# Patient Record
Sex: Male | Born: 1960
Health system: Southern US, Community
[De-identification: ages and names within clinical notes are randomized; demographics above are authoritative.]

## PROBLEM LIST (undated history)

## (undated) DIAGNOSIS — E785 Hyperlipidemia, unspecified: Secondary | ICD-10-CM

## (undated) DIAGNOSIS — G8929 Other chronic pain: Secondary | ICD-10-CM

## (undated) DIAGNOSIS — I10 Essential (primary) hypertension: Secondary | ICD-10-CM

## (undated) DIAGNOSIS — M549 Dorsalgia, unspecified: Secondary | ICD-10-CM

## (undated) DIAGNOSIS — C23 Malignant neoplasm of gallbladder: Secondary | ICD-10-CM

## (undated) DIAGNOSIS — I499 Cardiac arrhythmia, unspecified: Secondary | ICD-10-CM

## (undated) HISTORY — DX: Other chronic pain: G89.29

## (undated) HISTORY — PX: BACK SURGERY: SHX140

## (undated) HISTORY — DX: Cardiac arrhythmia, unspecified: I49.9

## (undated) HISTORY — DX: Dorsalgia, unspecified: M54.9

## (undated) HISTORY — DX: Hyperlipidemia, unspecified: E78.5

## (undated) HISTORY — DX: Essential (primary) hypertension: I10

---

## 2011-10-12 ENCOUNTER — Other Ambulatory Visit: Payer: Self-pay | Admitting: Internal Medicine

## 2011-10-12 DIAGNOSIS — M545 Low back pain, unspecified: Secondary | ICD-10-CM

## 2011-10-16 ENCOUNTER — Ambulatory Visit
Admission: RE | Admit: 2011-10-16 | Discharge: 2011-10-16 | Disposition: A | Discharge status: Home / Self Care | Payer: Self-pay | Source: Ambulatory Visit | Attending: Internal Medicine | Admitting: Internal Medicine

## 2011-10-16 DIAGNOSIS — M545 Low back pain, unspecified: Secondary | ICD-10-CM

## 2011-10-26 ENCOUNTER — Emergency Department: Payer: Self-pay | Admitting: Unknown Physician Specialty

## 2011-11-01 ENCOUNTER — Emergency Department: Payer: Self-pay

## 2014-01-02 ENCOUNTER — Encounter: Payer: Self-pay | Admitting: Cardiovascular Disease

## 2014-01-02 ENCOUNTER — Ambulatory Visit: Payer: Self-pay | Admitting: Cardiovascular Disease

## 2014-01-02 ENCOUNTER — Encounter (INDEPENDENT_AMBULATORY_CARE_PROVIDER_SITE_OTHER): Payer: Self-pay

## 2014-01-02 ENCOUNTER — Ambulatory Visit (INDEPENDENT_AMBULATORY_CARE_PROVIDER_SITE_OTHER): Payer: BC Managed Care – PPO | Admitting: Cardiovascular Disease

## 2014-01-02 VITALS — BP 159/90 | HR 58 | Ht 72.0 in | Wt 244.0 lb

## 2014-01-02 DIAGNOSIS — I10 Essential (primary) hypertension: Secondary | ICD-10-CM | POA: Insufficient documentation

## 2014-01-02 DIAGNOSIS — I499 Cardiac arrhythmia, unspecified: Secondary | ICD-10-CM

## 2014-01-02 DIAGNOSIS — E785 Hyperlipidemia, unspecified: Secondary | ICD-10-CM

## 2014-01-02 MED ORDER — CARVEDILOL 6.25 MG PO TABS: 6.2500 mg | ORAL_TABLET | Freq: Two times a day (BID) | ORAL | Status: DC

## 2014-01-02 MED ORDER — AMLODIPINE BESYLATE 5 MG PO TABS: 5.0000 mg | ORAL_TABLET | Freq: Every day | ORAL | Status: DC

## 2014-01-02 NOTE — Patient Instructions (Signed)
Stop taking Concor and Hydrochlorothiazide.   Start Amlodipine 5 mg once daily.  Start Carvedilol (Coreg) 6.25 mg twice daily.   Follow up in 2 months.

## 2014-01-02 NOTE — Assessment & Plan Note (Signed)
He will require a fasting lipid profile.

## 2014-01-02 NOTE — Assessment & Plan Note (Signed)
I made the following changes in his medications. I stopped Bisoprolol/amlodipine given that this formulation is not available in the [place]. I stopped hydrochlorothiazide. Continue treatment with Enalapril.  I added amlodipine 5 mg once daily and carvedilol 6.25 mg twice daily.  I discussed with him the importance of low sodium diet and decreasing the amount of alcohol intake.

## 2014-01-02 NOTE — Progress Notes (Signed)
   HPI  This is a 53 year old Arabic speaking male who is here today to establish cardiovascular care for management of hypertension. He is originally from [place]. He has no previous cardiac history but reports palpitations which improved with a beta blocker. He reports prolonged history of hypertension requiring multiple medications. No history of ischemic heart disease, stroke or congestive heart failure. He had back surgery done in [place] last year. He does smoke cigars and drinks excessive alcohol. He denies any chest pain, dyspnea or palpitations. He had "noise in his ears" after taking enalapril and hydrochlorothiazide.  No Known Allergies   No current outpatient prescriptions on file prior to visit.   No current facility-administered medications on file prior to visit.     Past Medical History  Diagnosis Date  . Irregular heart beat   . Hypertension   . Hyperlipidemia   . Chronic back pain      Past Surgical History  Procedure Laterality Date  . Back surgery       Family History  Problem Relation Age of Onset  . Family history unknown: Yes     History   Social History  . Marital Status: Married    Spouse Name: N/A    Number of Children: N/A  . Years of Education: N/A   Occupational History  . Not on file.   Social History Main Topics  . Smoking status: Current Some Day Smoker    Types: Cigars  . Smokeless tobacco: Not on file  . Alcohol Use: Yes     Comment: daily  . Drug Use: No  . Sexual Activity: Not on file   Other Topics Concern  . Not on file   Social History Narrative  . No narrative on file     ROS A 10 point review of system was performed. It is negative other than that mentioned in the history of present illness.   PHYSICAL EXAM   BP 159/90  Pulse 58  Ht 6' (1.829 m)  Wt 244 lb (110.678 kg)  BMI 33.09 kg/m2 Constitutional: He is oriented to person, place, and time. He appears well-developed and well-nourished. No distress.    HENT: No nasal discharge.  Head: Normocephalic and atraumatic.  Eyes: Pupils are equal and round.  No discharge. Neck: Normal range of motion. Neck supple. No JVD present. No thyromegaly present.  Cardiovascular: Normal rate, regular rhythm, normal heart sounds. Exam reveals no gallop and no friction rub. No murmur heard.  Pulmonary/Chest: Effort normal and breath sounds normal. No stridor. No respiratory distress. He has no wheezes. He has no rales. He exhibits no tenderness.  Abdominal: Soft. Bowel sounds are normal. He exhibits no distension. There is no tenderness. There is no rebound and no guarding.  Musculoskeletal: Normal range of motion. He exhibits no edema and no tenderness.  Neurological: He is alert and oriented to person, place, and time. Coordination normal.  Skin: Skin is warm and dry. No rash noted. He is not diaphoretic. No erythema. No pallor.  Psychiatric: He has a normal mood and affect. His behavior is normal. Judgment and thought content normal.       NWG:NFAOZEKG:Sinus  Bradycardia  -Left axis -anterior fascicular block.   -  Nonspecific T-abnormality.   ABNORMAL     ASSESSMENT AND PLAN

## 2014-03-06 ENCOUNTER — Ambulatory Visit (INDEPENDENT_AMBULATORY_CARE_PROVIDER_SITE_OTHER): Payer: BC Managed Care – PPO | Admitting: Cardiovascular Disease

## 2014-03-06 ENCOUNTER — Encounter: Payer: Self-pay | Admitting: Cardiovascular Disease

## 2014-03-06 ENCOUNTER — Encounter (INDEPENDENT_AMBULATORY_CARE_PROVIDER_SITE_OTHER): Payer: Self-pay

## 2014-03-06 VITALS — BP 160/110 | HR 52 | Ht 71.0 in | Wt 237.8 lb

## 2014-03-06 DIAGNOSIS — E785 Hyperlipidemia, unspecified: Secondary | ICD-10-CM

## 2014-03-06 DIAGNOSIS — I10 Essential (primary) hypertension: Secondary | ICD-10-CM

## 2014-03-06 MED ORDER — AMLODIPINE BESYLATE 10 MG PO TABS: 10.0000 mg | ORAL_TABLET | Freq: Every day | ORAL | Status: DC

## 2014-03-06 NOTE — Assessment & Plan Note (Signed)
Blood pressure is elevated today. He reports eating processed meat for lunch . I had a prolonged discussion with him about the importance of low sodium diet. We provided him with written education regarding low-sodium diet. I increased the dose of amlodipine to 10 mg once daily. The dose of carvedilol be increased further due to bradycardia. If blood pressure remains uncontrolled, I would consider adding spironolactone.

## 2014-03-06 NOTE — Progress Notes (Signed)
HPI  This is a 53 year old Arabic speaking male who is here today for a followup visit regarding  hypertension. He is originally from [place]. He has no previous cardiac history but reports palpitations which improved with a beta blocker. He reports prolonged history of hypertension requiring multiple medications. No history of ischemic heart disease, stroke or congestive heart failure. He had back surgery done in [place] last year. He does smoke cigars and drinks excessive alcohol. He denies any chest pain, dyspnea or palpitations. He had "noise in his ears" after taking enalapril and hydrochlorothiazide. I made some changes in his medication during last visit. He reports no side effects to new medications. Blood pressure at home is ranging from 130 to 145 mm mercury systolic. He continues to consume excessive amount of sodium.     No Known Allergies   Current Outpatient Prescriptions on File Prior to Visit  Medication Sig Dispense Refill  . carvedilol (COREG) 6.25 MG tablet Take 1 tablet (6.25 mg total) by mouth 2 (two) times daily.  60 tablet  6  . enalapril (VASOTEC) 20 MG tablet Take 20 mg by mouth daily.       No current facility-administered medications on file prior to visit.     Past Medical History  Diagnosis Date  . Irregular heart beat   . Hypertension   . Hyperlipidemia   . Chronic back pain      Past Surgical History  Procedure Laterality Date  . Back surgery       History reviewed. No pertinent family history.   History   Social History  . Marital Status: Married    Spouse Name: N/A    Number of Children: N/A  . Years of Education: N/A   Occupational History  . Not on file.   Social History Main Topics  . Smoking status: Current Some Day Smoker    Types: Cigars  . Smokeless tobacco: Not on file  . Alcohol Use: Yes     Comment: daily  . Drug Use: No  . Sexual Activity: Not on file   Other Topics Concern  . Not on file   Social History  Narrative  . No narrative on file     ROS A 10 point review of system was performed. It is negative other than that mentioned in the history of present illness.   PHYSICAL EXAM   BP 160/110  Pulse 52  Ht 5\' 11"  (1.803 m)  Wt 237 lb 12 oz (107.843 kg)  BMI 33.17 kg/m2 Constitutional: He is oriented to person, place, and time. He appears well-developed and well-nourished. No distress.  HENT: No nasal discharge.  Head: Normocephalic and atraumatic.  Eyes: Pupils are equal and round.  No discharge. Neck: Normal range of motion. Neck supple. No JVD present. No thyromegaly present.  Cardiovascular: Bradycardic, regular rhythm, normal heart sounds. Exam reveals no gallop and no friction rub. No murmur heard.  Pulmonary/Chest: Effort normal and breath sounds normal. No stridor. No respiratory distress. He has no wheezes. He has no rales. He exhibits no tenderness.  Abdominal: Soft. Bowel sounds are normal. He exhibits no distension. There is no tenderness. There is no rebound and no guarding.  Musculoskeletal: Normal range of motion. He exhibits no edema and no tenderness.  Neurological: He is alert and oriented to person, place, and time. Coordination normal.  Skin: Skin is warm and dry. No rash noted. He is not diaphoretic. No erythema. No pallor.  Psychiatric: He has a normal mood  and affect. His behavior is normal. Judgment and thought content normal.          ASSESSMENT AND PLAN

## 2014-03-06 NOTE — Patient Instructions (Signed)
Your physician has recommended you make the following change in your medication:  Increase Amlodipine to 10 mg once daily   Follow low sodium diet   Your physician recommends that you schedule a follow-up appointment in:  3 months

## 2014-03-06 NOTE — Assessment & Plan Note (Signed)
He will require a fasting lipid profile. 

## 2014-06-08 ENCOUNTER — Ambulatory Visit: Payer: BC Managed Care – PPO | Admitting: Cardiovascular Disease

## 2014-07-09 ENCOUNTER — Encounter: Payer: Self-pay | Admitting: Cardiovascular Disease

## 2014-07-09 ENCOUNTER — Ambulatory Visit (INDEPENDENT_AMBULATORY_CARE_PROVIDER_SITE_OTHER): Payer: BC Managed Care – PPO | Admitting: Cardiovascular Disease

## 2014-07-09 VITALS — BP 140/100 | HR 60 | Ht 71.0 in | Wt 229.4 lb

## 2014-07-09 DIAGNOSIS — E785 Hyperlipidemia, unspecified: Secondary | ICD-10-CM

## 2014-07-09 DIAGNOSIS — I10 Essential (primary) hypertension: Secondary | ICD-10-CM

## 2014-07-09 MED ORDER — EPLERENONE 25 MG PO TABS: 25.0000 mg | ORAL_TABLET | Freq: Every day | ORAL | Status: DC

## 2014-07-09 NOTE — Patient Instructions (Signed)
Start Eplerenone 25 mg once daily. Continue other medications.   Return for labs in 1 week.   Follow up in 3 months.

## 2014-07-09 NOTE — Progress Notes (Signed)
HPI  This is a 53 year old Arabic speakin52g male who is here today for a followup visit regarding  hypertension. He is originally from [place]. He has no previous cardiac history but reports palpitations which improved with a beta blocker. He reports prolonged history of hypertension requiring multiple medications. No history of ischemic heart disease, stroke or congestive heart failure. He had back surgery done in [place] last year. He does smoke cigars and drinks excessive alcohol. He denies any chest pain, dyspnea or palpitations. He had "noise in his ears" after taking enalapril and hydrochlorothiazide. This resolved after stopping hydrochlorothiazide. Amlodipine was increased during last visit. Blood pressure improved but is still not optimal.     No Known Allergies   Current Outpatient Prescriptions on File Prior to Visit  Medication Sig Dispense Refill  . amLODipine (NORVASC) 10 MG tablet Take 1 tablet (10 mg total) by mouth daily. 90 tablet 3  . carvedilol (COREG) 6.25 MG tablet Take 1 tablet (6.25 mg total) by mouth 2 (two) times daily. (Patient taking differently: Take 6.25 mg by mouth daily. ) 60 tablet 6  . enalapril (VASOTEC) 20 MG tablet Take 20 mg by mouth daily.     No current facility-administered medications on file prior to visit.     Past Medical History  Diagnosis Date  . Irregular heart beat   . Hypertension   . Hyperlipidemia   . Chronic back pain      Past Surgical History  Procedure Laterality Date  . Back surgery       No family history on file.   History   Social History  . Marital Status: Married    Spouse Name: N/A    Number of Children: N/A  . Years of Education: N/A   Occupational History  . Not on file.   Social History Main Topics  . Smoking status: Current Some Day Smoker    Types: Cigars  . Smokeless tobacco: Not on file  . Alcohol Use: Yes     Comment: daily  . Drug Use: No  . Sexual Activity: Not on file   Other Topics  Concern  . Not on file   Social History Narrative     ROS A 10 point review of system was performed. It is negative other than that mentioned in the history of present illness.   PHYSICAL EXAM   BP 140/100 mmHg  Pulse 60  Ht 5\' 11"  (1.803 m)  Wt 229 lb 6.4 oz (104.055 kg)  BMI 32.01 kg/m2 Constitutional: He is oriented to person, place, and time. He appears well-developed and well-nourished. No distress.  HENT: No nasal discharge.  Head: Normocephalic and atraumatic.  Eyes: Pupils are equal and round.  No discharge. Neck: Normal range of motion. Neck supple. No JVD present. No thyromegaly present.  Cardiovascular: Bradycardic, regular rhythm, normal heart sounds. Exam reveals no gallop and no friction rub. No murmur heard.  Pulmonary/Chest: Effort normal and breath sounds normal. No stridor. No respiratory distress. He has no wheezes. He has no rales. He exhibits no tenderness.  Abdominal: Soft. Bowel sounds are normal. He exhibits no distension. There is no tenderness. There is no rebound and no guarding.  Musculoskeletal: Normal range of motion. He exhibits no edema and no tenderness.  Neurological: He is alert and oriented to person, place, and time. Coordination normal.  Skin: Skin is warm and dry. No rash noted. He is not diaphoretic. No erythema. No pallor.  Psychiatric: He has a normal mood and  affect. His behavior is normal. Judgment and thought content normal.          ASSESSMENT AND PLAN

## 2014-07-10 NOTE — Assessment & Plan Note (Signed)
Check fasting lipid and liver profile with his labs.

## 2014-07-10 NOTE — Assessment & Plan Note (Signed)
Blood pressure improved from before but still not optimal. I added eplerenone 25 mg once daily. Check basic metabolic profile in one week.

## 2014-07-13 ENCOUNTER — Telehealth: Payer: Self-pay | Admitting: *Deleted

## 2014-07-13 DIAGNOSIS — E785 Hyperlipidemia, unspecified: Secondary | ICD-10-CM

## 2014-07-13 NOTE — Telephone Encounter (Signed)
-----   Message from [place] OuchMuhammad A Arida, MD sent at 07/10/2014  3:38 PM EST ----- He is coming for BMP next week. Can you also do fasting lipid and liver profile?

## 2014-07-15 ENCOUNTER — Other Ambulatory Visit (INDEPENDENT_AMBULATORY_CARE_PROVIDER_SITE_OTHER): Payer: BC Managed Care – PPO

## 2014-07-15 DIAGNOSIS — E785 Hyperlipidemia, unspecified: Secondary | ICD-10-CM

## 2014-07-15 DIAGNOSIS — I10 Essential (primary) hypertension: Secondary | ICD-10-CM

## 2014-07-16 LAB — BASIC METABOLIC PANEL
BUN/Creatinine Ratio: 16 (ref 9–20)
BUN: 17 mg/dL (ref 6–24)
CHLORIDE: 99 mmol/L (ref 97–108)
CO2: 24 mmol/L (ref 18–29)
CREATININE: 1.05 mg/dL (ref 0.76–1.27)
Calcium: 9.4 mg/dL (ref 8.7–10.2)
GFR calc Af Amer: 94 mL/min/{1.73_m2} (ref >59)
GFR calc non Af Amer: 81 mL/min/{1.73_m2} (ref >59)
GLUCOSE: 68 mg/dL (ref 65–99)
Potassium: 4.5 mmol/L (ref 3.5–5.2)
Sodium: 139 mmol/L (ref 134–144)

## 2014-07-16 LAB — HEPATIC FUNCTION PANEL
ALT: 20 IU/L (ref 0–44)
AST: 26 IU/L (ref 0–40)
Albumin: 4.6 g/dL (ref 3.5–5.5)
Alkaline Phosphatase: 92 IU/L (ref 39–117)
BILIRUBIN DIRECT: 0.15 mg/dL (ref 0.00–0.40)
BILIRUBIN TOTAL: 0.6 mg/dL (ref 0.0–1.2)
Total Protein: 7.4 g/dL (ref 6.0–8.5)

## 2014-07-16 LAB — LIPID PANEL
Chol/HDL Ratio: 4.7 ratio units (ref 0.0–5.0)
Cholesterol, Total: 151 mg/dL (ref 100–199)
HDL: 32 mg/dL — ABNORMAL LOW (ref >39)
LDL CALC: 80 mg/dL (ref 0–99)
TRIGLYCERIDES: 195 mg/dL — AB (ref 0–149)
VLDL Cholesterol Cal: 39 mg/dL (ref 5–40)

## 2014-10-09 ENCOUNTER — Encounter (INDEPENDENT_AMBULATORY_CARE_PROVIDER_SITE_OTHER): Payer: Self-pay

## 2014-10-09 ENCOUNTER — Ambulatory Visit (INDEPENDENT_AMBULATORY_CARE_PROVIDER_SITE_OTHER): Payer: No Typology Code available for payment source | Admitting: Cardiovascular Disease

## 2014-10-09 ENCOUNTER — Encounter: Payer: Self-pay | Admitting: Cardiovascular Disease

## 2014-10-09 VITALS — BP 130/86 | HR 64 | Ht 72.0 in | Wt 229.5 lb

## 2014-10-09 DIAGNOSIS — E785 Hyperlipidemia, unspecified: Secondary | ICD-10-CM

## 2014-10-09 DIAGNOSIS — I10 Essential (primary) hypertension: Secondary | ICD-10-CM

## 2014-10-09 NOTE — Assessment & Plan Note (Signed)
Lab Results  Component Value Date   HDL 32* 07/15/2014   LDLCALC 80 07/15/2014   TRIG 195* 07/15/2014   CHOLHDL 4.7 07/15/2014   He has low HDL and mildly elevated triglyceride. This should improve with increased exercise and weight loss.

## 2014-10-09 NOTE — Progress Notes (Signed)
HPI  This is a 54 year old Arabic speaking male who is here today for a followup visit regarding  hypertension. He is originally from [place]. He has no previous cardiac history but reports palpitations which improved with a beta blocker. He reports prolonged history of hypertension requiring multiple medications. No history of ischemic heart disease, stroke or congestive heart failure. He had back surgery done in [place] last year. He does smoke cigars and drinks excessive alcohol. He denies any chest pain, dyspnea or palpitations. He had "noise in his ears" after taking enalapril and hydrochlorothiazide. This resolved after stopping hydrochlorothiazide. I added eplerenone during last visit with significant improvement in blood pressure. Follow-up basic metabolic profile was normal.     No Known Allergies   Current Outpatient Prescriptions on File Prior to Visit  Medication Sig Dispense Refill  . amLODipine (NORVASC) 10 MG tablet Take 1 tablet (10 mg total) by mouth daily. 90 tablet 3  . carvedilol (COREG) 6.25 MG tablet Take 1 tablet (6.25 mg total) by mouth 2 (two) times daily. (Patient taking differently: Take 6.25 mg by mouth daily. ) 60 tablet 6  . enalapril (VASOTEC) 20 MG tablet Take 20 mg by mouth daily.    Marland Kitchen eplerenone (INSPRA) 25 MG tablet Take 1 tablet (25 mg total) by mouth daily. 30 tablet 6   No current facility-administered medications on file prior to visit.     Past Medical History  Diagnosis Date  . Irregular heart beat   . Hypertension   . Hyperlipidemia   . Chronic back pain      Past Surgical History  Procedure Laterality Date  . Back surgery       Family History  Problem Relation Age of Onset  . Family history unknown: Yes     History   Social History  . Marital Status: Married    Spouse Name: N/A  . Number of Children: N/A  . Years of Education: N/A   Occupational History  . Not on file.   Social History Main Topics  . Smoking status:  Current Some Day Smoker    Types: Cigars  . Smokeless tobacco: Not on file  . Alcohol Use: Yes     Comment: daily  . Drug Use: No  . Sexual Activity: Not on file   Other Topics Concern  . Not on file   Social History Narrative     ROS A 10 point review of system was performed. It is negative other than that mentioned in the history of present illness.   PHYSICAL EXAM   BP 130/86 mmHg  Pulse 64  Ht 6' (1.829 m)  Wt 229 lb 8 oz (104.101 kg)  BMI 31.12 kg/m2 Constitutional: He is oriented to person, place, and time. He appears well-developed and well-nourished. No distress.  HENT: No nasal discharge.  Head: Normocephalic and atraumatic.  Eyes: Pupils are equal and round.  No discharge. Neck: Normal range of motion. Neck supple. No JVD present. No thyromegaly present.  Cardiovascular: Bradycardic, regular rhythm, normal heart sounds. Exam reveals no gallop and no friction rub. No murmur heard.  Pulmonary/Chest: Effort normal and breath sounds normal. No stridor. No respiratory distress. He has no wheezes. He has no rales. He exhibits no tenderness.  Abdominal: Soft. Bowel sounds are normal. He exhibits no distension. There is no tenderness. There is no rebound and no guarding.  Musculoskeletal: Normal range of motion. He exhibits no edema and no tenderness.  Neurological: He is alert and oriented to  person, place, and time. Coordination normal.  Skin: Skin is warm and dry. No rash noted. He is not diaphoretic. No erythema. No pallor.  Psychiatric: He has a normal mood and affect. His behavior is normal. Judgment and thought content normal.          ASSESSMENT AND PLAN

## 2014-10-09 NOTE — Assessment & Plan Note (Signed)
Blood pressure is now well controlled on current medications. I made no changes. I advised him to cut down on alcohol intake. He is already exercising more and has lost weight since his last visit.

## 2014-10-09 NOTE — Patient Instructions (Signed)
Continue same medications.   Your physician wants you to follow-up in: 6 months.  You will receive a reminder letter in the mail two months in advance. If you don't receive a letter, please call our office to schedule the follow-up appointment.  

## 2015-06-24 ENCOUNTER — Telehealth: Payer: Self-pay | Admitting: Cardiovascular Disease

## 2015-06-24 NOTE — Telephone Encounter (Signed)
3 attempts to schedule from recall. List  Deleting recall.

## 2015-09-06 ENCOUNTER — Ambulatory Visit (INDEPENDENT_AMBULATORY_CARE_PROVIDER_SITE_OTHER): Payer: Self-pay | Admitting: Cardiovascular Disease

## 2015-09-06 ENCOUNTER — Encounter: Payer: Self-pay | Admitting: Cardiovascular Disease

## 2015-09-06 VITALS — BP 160/90 | HR 56 | Ht 71.0 in | Wt 231.5 lb

## 2015-09-06 DIAGNOSIS — I10 Essential (primary) hypertension: Secondary | ICD-10-CM

## 2015-09-06 DIAGNOSIS — E785 Hyperlipidemia, unspecified: Secondary | ICD-10-CM

## 2015-09-06 MED ORDER — CARVEDILOL 6.25 MG PO TABS: 6.2500 mg | ORAL_TABLET | Freq: Every day | ORAL | Status: DC

## 2015-09-06 MED ORDER — ENALAPRIL MALEATE 20 MG PO TABS: 20.0000 mg | ORAL_TABLET | Freq: Every day | ORAL | Status: DC

## 2015-09-06 MED ORDER — EPLERENONE 25 MG PO TABS: 25.0000 mg | ORAL_TABLET | Freq: Every day | ORAL | Status: DC

## 2015-09-06 MED ORDER — AMLODIPINE BESYLATE 10 MG PO TABS: 10.0000 mg | ORAL_TABLET | Freq: Every day | ORAL | Status: DC

## 2015-09-06 NOTE — Assessment & Plan Note (Signed)
Lab Results  Component Value Date   CHOL 151 07/15/2014   HDL 32* 07/15/2014   LDLCALC 80 07/15/2014   TRIG 195* 07/15/2014   CHOLHDL 4.7 07/15/2014   He has low HDL and mildly elevated lipid profile. I requested a repeat follow-up lipid and liver profile.

## 2015-09-06 NOTE — Patient Instructions (Addendum)
Medication Instructions:  Your physician recommends that you continue on your current medications as directed. Please refer to the Current Medication list given to you today.   Labwork: Fasting liver and lipid profile. Nothing to eat or drink after midnight the evening before your labs.  BMET  Testing/Procedures: none  Follow-Up: Your physician wants you to follow-up in: one year with Dr. Kirke CorinArida.  You will receive a reminder letter in the mail two months in advance. If you don't receive a letter, please call our office to schedule the follow-up appointment.   Any Other Special Instructions Will Be Listed Below (If Applicable).     If you need a refill on your cardiac medications before your next appointment, please call your pharmacy.

## 2015-09-06 NOTE — Progress Notes (Signed)
HPI  This is a 55 year old Arabic speaking male who is here today for a followup visit regarding  hypertension. He is originally from [place]. He has no previous cardiac history but reports palpitations which improved with a beta blocker. He reports prolonged history of hypertension requiring multiple medications. No history of ischemic heart disease, stroke or congestive heart failure. He had back surgery done in [place]. He does smoke cigars and drinks excessive alcohol. He denies any chest pain, dyspnea or palpitations. He had "noise in his ears" after  hydrochlorothiazide.  He has been taking his medications regularly. He takes carvedilol once daily instead of twice daily. His blood pressure is well controlled at home with systolic blood pressure in the 130 range and sometimes it goes down to around 110. His blood pressure is elevated today but he was rushing to get his appointment.      No Known Allergies   Current Outpatient Prescriptions on File Prior to Visit  Medication Sig Dispense Refill  . amLODipine (NORVASC) 10 MG tablet Take 1 tablet (10 mg total) by mouth daily. 90 tablet 3  . carvedilol (COREG) 6.25 MG tablet Take 1 tablet (6.25 mg total) by mouth 2 (two) times daily. (Patient taking differently: Take 6.25 mg by mouth daily. ) 60 tablet 6  . enalapril (VASOTEC) 20 MG tablet Take 20 mg by mouth daily.    Marland Kitchen. eplerenone (INSPRA) 25 MG tablet Take 1 tablet (25 mg total) by mouth daily. 30 tablet 6   No current facility-administered medications on file prior to visit.     Past Medical History  Diagnosis Date  . Irregular heart beat   . Hypertension   . Hyperlipidemia   . Chronic back pain      Past Surgical History  Procedure Laterality Date  . Back surgery       Family History  Problem Relation Age of Onset  . Family history unknown: Yes     Social History   Social History  . Marital Status: Married    Spouse Name: N/A  . Number of Children: N/A  .  Years of Education: N/A   Occupational History  . Not on file.   Social History Main Topics  . Smoking status: Current Some Day Smoker    Types: Cigars  . Smokeless tobacco: Not on file  . Alcohol Use: Yes     Comment: daily  . Drug Use: No  . Sexual Activity: Not on file   Other Topics Concern  . Not on file   Social History Narrative     ROS A 10 point review of system was performed. It is negative other than that mentioned in the history of present illness.   PHYSICAL EXAM   BP 160/90 mmHg  Pulse 56  Ht 5\' 11"  (1.803 m)  Wt 231 lb 8 oz (105.008 kg)  BMI 32.30 kg/m2 Constitutional: He is oriented to person, place, and time. He appears well-developed and well-nourished. No distress.  HENT: No nasal discharge.  Head: Normocephalic and atraumatic.  Eyes: Pupils are equal and round.  No discharge. Neck: Normal range of motion. Neck supple. No JVD present. No thyromegaly present.  Cardiovascular: Bradycardic, regular rhythm, normal heart sounds. Exam reveals no gallop and no friction rub. No murmur heard.  Pulmonary/Chest: Effort normal and breath sounds normal. No stridor. No respiratory distress. He has no wheezes. He has no rales. He exhibits no tenderness.  Abdominal: Soft. Bowel sounds are normal. He exhibits no distension. There  is no tenderness. There is no rebound and no guarding.  Musculoskeletal: Normal range of motion. He exhibits no edema and no tenderness.  Neurological: He is alert and oriented to person, place, and time. Coordination normal.  Skin: Skin is warm and dry. No rash noted. He is not diaphoretic. No erythema. No pallor.  Psychiatric: He has a normal mood and affect. His behavior is normal. Judgment and thought content normal.      EKG: Sinus bradycardia with left axis deviation. No significant ST or T wave changes.    ASSESSMENT AND PLAN

## 2015-09-06 NOTE — Assessment & Plan Note (Signed)
Blood pressure is well controlled at home although it is elevated today. I made no changes today. Check basic metabolic profile. I discussed with him the importance of low sodium diet and decreasing alcohol intake.

## 2015-09-08 ENCOUNTER — Other Ambulatory Visit: Payer: Self-pay

## 2015-09-08 DIAGNOSIS — E785 Hyperlipidemia, unspecified: Secondary | ICD-10-CM

## 2015-09-08 DIAGNOSIS — I10 Essential (primary) hypertension: Secondary | ICD-10-CM

## 2015-09-09 LAB — BASIC METABOLIC PANEL
BUN / CREAT RATIO: 16 (ref 9–20)
BUN: 19 mg/dL (ref 6–24)
CHLORIDE: 103 mmol/L (ref 96–106)
CO2: 22 mmol/L (ref 18–29)
Calcium: 9 mg/dL (ref 8.7–10.2)
Creatinine, Ser: 1.16 mg/dL (ref 0.76–1.27)
GFR, EST AFRICAN AMERICAN: 83 mL/min/{1.73_m2} (ref >59)
GFR, EST NON AFRICAN AMERICAN: 71 mL/min/{1.73_m2} (ref >59)
Glucose: 86 mg/dL (ref 65–99)
POTASSIUM: 4.2 mmol/L (ref 3.5–5.2)
SODIUM: 142 mmol/L (ref 134–144)

## 2015-09-09 LAB — HEPATIC FUNCTION PANEL
ALBUMIN: 4.7 g/dL (ref 3.5–5.5)
ALK PHOS: 88 IU/L (ref 39–117)
ALT: 29 IU/L (ref 0–44)
AST: 35 IU/L (ref 0–40)
BILIRUBIN TOTAL: 0.4 mg/dL (ref 0.0–1.2)
BILIRUBIN, DIRECT: 0.11 mg/dL (ref 0.00–0.40)
Total Protein: 7.4 g/dL (ref 6.0–8.5)

## 2015-09-09 LAB — LIPID PANEL
CHOLESTEROL TOTAL: 152 mg/dL (ref 100–199)
Chol/HDL Ratio: 5.2 ratio units — ABNORMAL HIGH (ref 0.0–5.0)
HDL: 29 mg/dL — AB (ref >39)
LDL CALC: 87 mg/dL (ref 0–99)
TRIGLYCERIDES: 178 mg/dL — AB (ref 0–149)
VLDL Cholesterol Cal: 36 mg/dL (ref 5–40)

## 2015-09-10 NOTE — Patient Instructions (Signed)
Fat and Cholesterol Restricted Diet High levels of fat and cholesterol in your blood may lead to various health problems, such as diseases of the heart, blood vessels, gallbladder, liver, and pancreas. Fats are concentrated sources of energy that come in various forms. Certain types of fat, including saturated fat, may be harmful in excess. Cholesterol is a substance needed by your body in small amounts. Your body makes all the cholesterol it needs. Excess cholesterol comes from the food you eat. When you have high levels of cholesterol and saturated fat in your blood, health problems can develop because the excess fat and cholesterol will gather along the walls of your blood vessels, causing them to narrow. Choosing the right foods will help you control your intake of fat and cholesterol. This will help keep the levels of these substances in your blood within normal limits and reduce your risk of disease. WHAT IS MY PLAN? Your health care provider recommends that you:  Limit your intake of saturated fatLimit the amount of cholesterol in your diet. WHAT TYPES OF FAT SHOULD I CHOOSE?  Choose healthy fats more often. Choose monounsaturated and polyunsaturated fats, such as olive and canola oil, flaxseeds, walnuts, almonds, and seeds.  Eat more omega-3 fats. Good choices include salmon, mackerel, sardines, tuna, flaxseed oil, and ground flaxseeds. Aim to eat fish at least two times a week.  Limit saturated fats. Saturated fats are primarily found in animal products, such as meats, butter, and cream. Plant sources of saturated fats include palm oil, palm kernel oil, and coconut oil.  Avoid foods with partially hydrogenated oils in them. These contain trans fats. Examples of foods that contain trans fats are stick margarine, some tub margarines, cookies, crackers, and other baked goods. WHAT GENERAL GUIDELINES DO I NEED TO FOLLOW? These guidelines for healthy eating will help you control your intake of  fat and cholesterol:  Check food labels carefully to identify foods with trans fats or high amounts of saturated fat.  Fill one half of your plate with vegetables and green salads.  Fill one fourth of your plate with whole grains. Look for the word "whole" as the first word in the ingredient list.  Fill one fourth of your plate with lean protein foods.  Limit fruit to two servings a day. Choose fruit instead of juice.  Eat more foods that contain soluble fiber. Examples of foods that contain this type of fiber are apples, broccoli, carrots, beans, peas, and barley. Aim to get 20-30 g of fiber per day.  Eat more home-cooked food and less restaurant, buffet, and fast food.  Limit or avoid alcohol.  Limit foods high in starch and sugar.  Limit fried foods.  Cook foods using methods other than frying. Baking, boiling, grilling, and broiling are all great options.  Lose weight if you are overweight. Losing just 5-10% of your initial body weight can help your overall health and prevent diseases such as diabetes and heart disease. WHAT FOODS CAN I EAT? Grains Whole grains, such as whole wheat or whole grain breads, crackers, cereals, and pasta. Unsweetened oatmeal, bulgur, barley, quinoa, or brown rice. Corn or whole wheat flour tortillas. Vegetables Fresh or frozen vegetables (raw, steamed, roasted, or grilled). Green salads. Fruits All fresh, canned (in natural juice), or frozen fruits. Meat and Other Protein Products Ground beef (85% or leaner), grass-fed beef, or beef trimmed of fat. Skinless chicken or [place]. Ground chicken or [place]. Pork trimmed of fat. All fish and seafood. Eggs. Dried beans, peas,  or lentils. Unsalted nuts or seeds. Unsalted canned or dry beans. Dairy Low-fat dairy products, such as skim or 1% milk, 2% or reduced-fat cheeses, low-fat ricotta or cottage cheese, or plain low-fat yogurt. Fats and Oils Tub margarines without trans fats. Light or reduced-fat  mayonnaise and salad dressings. Avocado. Olive, canola, sesame, or safflower oils. Natural peanut or almond butter (choose ones without added sugar and oil). The items listed above may not be a complete list of recommended foods or beverages. Contact your dietitian for more options. WHAT FOODS ARE NOT RECOMMENDED? Grains White bread. White pasta. White rice. Cornbread. Bagels, pastries, and croissants. Crackers that contain trans fat. Vegetables White potatoes. Corn. Creamed or fried vegetables. Vegetables in a cheese sauce. Fruits Dried fruits. Canned fruit in light or heavy syrup. Fruit juice. Meat and Other Protein Products Fatty cuts of meat. Ribs, chicken wings, bacon, sausage, bologna, salami, chitterlings, fatback, hot dogs, bratwurst, and packaged luncheon meats. Liver and organ meats. Dairy Whole or 2% milk, cream, half-and-half, and cream cheese. Whole milk cheeses. Whole-fat or sweetened yogurt. Full-fat cheeses. Nondairy creamers and whipped toppings. Processed cheese, cheese spreads, or cheese curds. Sweets and Desserts Corn syrup, sugars, honey, and molasses. Candy. Jam and jelly. Syrup. Sweetened cereals. Cookies, pies, cakes, donuts, muffins, and ice cream. Fats and Oils Butter, stick margarine, lard, shortening, ghee, or bacon fat. Coconut, palm kernel, or palm oils. Beverages Alcohol. Sweetened drinks (such as sodas, lemonade, and fruit drinks or punches). The items listed above may not be a complete list of foods and beverages to avoid. Contact your dietitian for more information.   This information is not intended to replace advice given to you by your health care provider. Make sure you discuss any questions you have with your health care provider.   Document Released: 08/07/2005 Document Revised: 08/28/2014 Document Reviewed: 11/05/2013 Elsevier Interactive Patient Education 2016 Else[employer] Inc.  Basic Carbohydrate Counting Carbohydrate counting is a method for keeping  track of the amount of carbohydrates you eat. Eating carbohydrates naturally increases the level of sugar (glucose) in your blood, so it is important for you to know the amount that is okay for you to have in every meal. Carbohydrate counting helps keep the level of glucose in your blood within normal limits. The amount of carbohydrates allowed is different for every person. A dietitian can help you calculate the amount that is right for you. Once you know the amount of carbohydrates you can have, you can count the carbohydrates in the foods you want to eat. Carbohydrates are found in the following foods:  Grains, such as breads and cereals.  Dried beans and soy products.  Starchy vegetables, such as potatoes, peas, and corn.  Fruit and fruit juices.  Milk and yogurt.  Sweets and snack foods, such as cake, cookies, candy, chips, soft drinks, and fruit drinks. CARBOHYDRATE COUNTING There are two ways to count the carbohydrates in your food. You can use either of the methods or a combination of both. Reading the "Nutrition Facts" on Packaged Food The "Nutrition Facts" is an area that is included on the labels of almost all packaged food and beverages in the [place]. It includes the serving size of that food or beverage and information about the nutrients in each serving of the food, including the grams (g) of carbohydrate per serving.  Decide the number of servings of this food or beverage that you will be able to eat or drink. Multiply that number of servings by the number  of grams of carbohydrate that is listed on the label for that serving. The total will be the amount of carbohydrates you will be having when you eat or drink this food or beverage. Learning Standard Serving Sizes of Food When you eat food that is not packaged or does not include "Nutrition Facts" on the label, you need to measure the servings in order to count the amount of carbohydrates.A serving of most  carbohydrate-rich foods contains about 15 g of carbohydrates. The following list includes serving sizes of carbohydrate-rich foods that provide 15 g ofcarbohydrate per serving:   1 slice of bread (1 oz) or 1 six-inch tortilla.    of a hamburger bun or English muffin.  4-6 crackers.   cup unsweetened dry cereal.    cup hot cereal.   cup rice or pasta.    cup mashed potatoes or  of a large baked potato.  1 cup fresh fruit or one small piece of fruit.    cup canned or frozen fruit or fruit juice.  1 cup milk.   cup plain fat-free yogurt or yogurt sweetened with artificial sweeteners.   cup cooked dried beans or starchy vegetable, such as peas, corn, or potatoes.  Decide the number of standard-size servings that you will eat. Multiply that number of servings by 15 (the grams of carbohydrates in that serving). For example, if you eat 2 cups of strawberries, you will have eaten 2 servings and 30 g of carbohydrates (2 servings x 15 g = 30 g). For foods such as soups and casseroles, in which more than one food is mixed in, you will need to count the carbohydrates in each food that is included. EXAMPLE OF CARBOHYDRATE COUNTING Sample Dinner  3 oz chicken breast.   cup of brown rice.   cup of corn.  1 cup milk.   1 cup strawberries with sugar-free whipped topping.  Carbohydrate Calculation Step 1: Identify the foods that contain carbohydrates:   Rice.   Corn.   Milk.   Strawberries. Step 2:Calculate the number of servings eaten of each:   2 servings of rice.   1 serving of corn.   1 serving of milk.   1 serving of strawberries. Step 3: Multiply each of those number of servings by 15 g:   2 servings of rice x 15 g = 30 g.   1 serving of corn x 15 g = 15 g.   1 serving of milk x 15 g = 15 g.   1 serving of strawberries x 15 g = 15 g. Step 4: Add together all of the amounts to find the total grams of carbohydrates eaten: 30 g + 15 g +  15 g + 15 g = 75 g.   This information is not intended to replace advice given to you by your health care provider. Make sure you discuss any questions you have with your health care provider.   Document Released: 08/07/2005 Document Revised: 08/28/2014 Document Reviewed: 07/04/2013 Elsevier Interactive Patient Education [employer]

## 2015-10-07 ENCOUNTER — Ambulatory Visit: Payer: No Typology Code available for payment source | Admitting: Cardiovascular Disease

## 2016-06-20 ENCOUNTER — Encounter: Payer: Self-pay | Admitting: Cardiovascular Disease

## 2016-06-20 ENCOUNTER — Ambulatory Visit (INDEPENDENT_AMBULATORY_CARE_PROVIDER_SITE_OTHER): Payer: Self-pay | Admitting: Cardiovascular Disease

## 2016-06-20 VITALS — BP 158/84 | HR 58 | Ht 73.0 in | Wt 241.5 lb

## 2016-06-20 DIAGNOSIS — I10 Essential (primary) hypertension: Secondary | ICD-10-CM

## 2016-06-20 DIAGNOSIS — E781 Pure hyperglyceridemia: Secondary | ICD-10-CM

## 2016-06-20 MED ORDER — EPLERENONE 50 MG PO TABS: 50.0000 mg | ORAL_TABLET | Freq: Every day | ORAL | 5 refills | Status: DC

## 2016-06-20 NOTE — Patient Instructions (Signed)
Medication Instructions:  Your physician has recommended you make the following change in your medication:  STOP taking coreg INCREASE inspra to 50mg  once daily   Labwork: none  Testing/Procedures: none  Follow-Up: Your physician wants you to follow-up in: six months with Dr. Kirke CorinArida. You will receive a reminder letter in the mail two months in advance. If you don't receive a letter, please call our office to schedule the follow-up appointment.   Any Other Special Instructions Will Be Listed Below (If Applicable).     If you need a refill on your cardiac medications before your next appointment, please call your pharmacy.

## 2016-06-20 NOTE — Progress Notes (Signed)
Cardiology Office Note   Date:  06/20/2016   ID:  Michael Sims, DOB [birth year], MRN [id number]  PCP:  No PCP Per Patient  Cardiologist:   Lorine BearsMuhammad Raveen Wieseler, MD   Chief Complaint  Patient presents with  . other    12 month follow up. Meds reviewed by the pt. verbally. "doing well."       History of Present Illness: Michael PihMichel Louro is a 55 y.o. male who presents for a followup visit regarding hypertension. He is originally from [place].He has prolonged history of hypertension requiring multiple medications. No history of ischemic heart disease, stroke or congestive heart failure. He had back surgery done in [place]. He does smoke cigars and drinks excessive alcohol mainly whiskey on a daily basis. He denies any chest pain, dyspnea or palpitations. He had "noise in his ears" after  hydrochlorothiazide.  He reports that he has not been taking carvedilol in a regular basis as it does not have much effect on his blood pressure. He has been taking the other medications.   Past Medical History:  Diagnosis Date  . Chronic back pain   . Hyperlipidemia   . Hypertension   . Irregular heart beat     Past Surgical History:  Procedure Laterality Date  . BACK SURGERY       Current Outpatient Prescriptions  Medication Sig Dispense Refill  . amLODipine (NORVASC) 10 MG tablet Take 1 tablet (10 mg total) by mouth daily. 90 tablet 3  . carvedilol (COREG) 6.25 MG tablet Take 1 tablet (6.25 mg total) by mouth daily. 90 tablet 3  . enalapril (VASOTEC) 20 MG tablet Take 1 tablet (20 mg total) by mouth daily. 90 tablet 3  . eplerenone (INSPRA) 25 MG tablet Take 1 tablet (25 mg total) by mouth daily. 90 tablet 3   No current facility-administered medications for this visit.     Allergies:   Review of patient's allergies indicates no known allergies.    Social History:  The patient  reports that he has been smoking Cigars.  He has never used smokeless tobacco. He reports that he drinks  alcohol. He reports that he does not use drugs.   Family History:  The patient's Family history is unknown by patient.    ROS:  Please see the history of present illness.   Otherwise, review of systems are positive for none.   All other systems are reviewed and negative.    PHYSICAL EXAM: VS:  BP (!) 158/84 (BP Location: Left Arm, Patient Position: Sitting, Cuff Size: Normal)   Pulse (!) 58   Ht 6\' 1"  (1.854 m)   Wt 241 lb 8 oz (109.5 kg)   BMI 31.86 kg/m  , BMI Body mass index is 31.86 kg/m. GEN: Well nourished, well developed, in no acute distress  HEENT: normal  Neck: no JVD, carotid bruits, or masses Cardiac: RRR; no murmurs, rubs, or gallops,no edema  Respiratory:  clear to auscultation bilaterally, normal work of breathing GI: soft, nontender, nondistended, + BS MS: no deformity or atrophy  Skin: warm and dry, no rash Neuro:  Strength and sensation are intact Psych: euthymic mood, full affect   EKG:  EKG is ordered today. The ekg ordered today demonstrates sinus bradycardia with left anterior fascicular block and borderline LVH.   Recent Labs: 09/08/2015: ALT 29; BUN 19; Creatinine, Ser 1.16; Potassium 4.2; Sodium 142    Lipid Panel    Component Value Date/Time   CHOL 152 09/08/2015 0918   TRIG  178 (H) 09/08/2015 0918   HDL 29 (L) 09/08/2015 0918   CHOLHDL 5.2 (H) 09/08/2015 0918   LDLCALC 87 09/08/2015 0918      Wt Readings from Last 3 Encounters:  06/20/16 241 lb 8 oz (109.5 kg)  09/06/15 231 lb 8 oz (105 kg)  10/09/14 229 lb 8 oz (104.1 kg)      No flowsheet data found.    ASSESSMENT AND PLAN:  1.  Essential hypertension: Blood pressure is mildly elevated but he reports better readings at home. He has not been taking carvedilol as he reports no significant effect on his blood pressure. I elected to stop this altogether and increased eplerenone to 50 mg once daily. Continue current dose of amlodipine and enalapril.  2. Hyperlipidemia: Lipid profile  this year showed a cholesterol of 152, triglyceride of 178 and an LDL of 87. We discussed the importance of healthy diet.  3. Excessive alcohol use: This might be the culprit for his elevated blood pressure but he reports inability to quit completely. He is trying to cut down.   Disposition:   FU with me in 6 months  Signed,  Lorine BearsMuhammad Delorse Shane, MD  06/20/2016 3:35 PM    [facility]

## 2016-06-26 ENCOUNTER — Ambulatory Visit: Payer: Self-pay | Admitting: Cardiovascular Disease

## 2016-10-21 ENCOUNTER — Other Ambulatory Visit: Payer: Self-pay | Admitting: Cardiovascular Disease

## 2016-12-01 ENCOUNTER — Other Ambulatory Visit: Payer: Self-pay | Admitting: Cardiovascular Disease

## 2018-02-06 ENCOUNTER — Other Ambulatory Visit: Payer: Self-pay | Admitting: Cardiovascular Disease

## 2018-02-08 ENCOUNTER — Telehealth: Payer: Self-pay | Admitting: *Deleted

## 2018-02-08 MED ORDER — ENALAPRIL MALEATE 20 MG PO TABS: 20.0000 mg | ORAL_TABLET | Freq: Every day | ORAL | 0 refills | Status: DC

## 2018-02-08 MED ORDER — AMLODIPINE BESYLATE 10 MG PO TABS: 10.0000 mg | ORAL_TABLET | Freq: Every day | ORAL | 0 refills | Status: DC

## 2018-02-08 MED ORDER — EPLERENONE 50 MG PO TABS: 50.0000 mg | ORAL_TABLET | Freq: Every day | ORAL | 0 refills | Status: DC

## 2018-02-08 NOTE — Telephone Encounter (Signed)
Refills sent in for the patient. Follow up appointment made for 04/18/18.

## 2018-04-18 ENCOUNTER — Ambulatory Visit: Payer: Self-pay | Admitting: Cardiovascular Disease

## 2018-04-23 ENCOUNTER — Encounter: Payer: Self-pay | Admitting: Cardiovascular Disease

## 2018-05-17 ENCOUNTER — Telehealth: Payer: Self-pay

## 2018-05-17 MED ORDER — AMLODIPINE BESYLATE 10 MG PO TABS: 10.0000 mg | ORAL_TABLET | Freq: Every day | ORAL | 3 refills | Status: DC

## 2018-05-17 MED ORDER — ENALAPRIL MALEATE 20 MG PO TABS: 20.0000 mg | ORAL_TABLET | Freq: Every day | ORAL | 3 refills | Status: DC

## 2018-05-17 MED ORDER — EPLERENONE 50 MG PO TABS: 50.0000 mg | ORAL_TABLET | Freq: Every day | ORAL | 3 refills | Status: DC

## 2018-05-17 NOTE — Telephone Encounter (Signed)
Dr. Kirke Corin approved refills for Amlodipine, Eplerenone and Enalapril.  Patient notified of refills and of appointment on Oct. 15, 2019.

## 2018-06-04 ENCOUNTER — Encounter: Payer: Self-pay | Admitting: Cardiovascular Disease

## 2018-06-04 ENCOUNTER — Ambulatory Visit: Payer: BLUE CROSS/BLUE SHIELD | Admitting: Cardiovascular Disease

## 2018-06-04 VITALS — BP 145/84 | HR 59 | Ht 72.0 in | Wt 238.2 lb

## 2018-06-04 DIAGNOSIS — E785 Hyperlipidemia, unspecified: Secondary | ICD-10-CM

## 2018-06-04 DIAGNOSIS — I10 Essential (primary) hypertension: Secondary | ICD-10-CM

## 2018-06-04 MED ORDER — AMLODIPINE BESYLATE 10 MG PO TABS: 10.0000 mg | ORAL_TABLET | Freq: Every day | ORAL | 3 refills | Status: DC

## 2018-06-04 MED ORDER — EPLERENONE 50 MG PO TABS: 50.0000 mg | ORAL_TABLET | Freq: Every day | ORAL | 3 refills | Status: DC

## 2018-06-04 MED ORDER — ENALAPRIL MALEATE 20 MG PO TABS: 20.0000 mg | ORAL_TABLET | Freq: Every day | ORAL | 3 refills | Status: DC

## 2018-06-04 NOTE — Progress Notes (Signed)
Cardiology Office Note   Date:  06/04/2018   ID:  Michael Sims, DOB 08/28/1960, MRN [id number]  PCP:  Patient, No Pcp Per  Cardiologist:   Lorine Bears, MD   Chief Complaint  Patient presents with  . OTHER    No complaints today. Meds reviewed verbally with pt.      History of Present Illness: Michael Sims is a 57 y.o. male who presents for a followup visit regarding hypertension. He is originally from [place].He has prolonged history of hypertension requiring multiple medications. No history of ischemic heart disease, stroke or congestive heart failure. He had back surgery done in [place]. He does smoke cigars and drinks excessive alcohol mainly whiskey on a daily basis. He denies any chest pain, dyspnea or palpitations. He had "noise in his ears" after  hydrochlorothiazide.  Carvedilol was not very effective and was discontinued in 2017. He has been doing very well with no recent chest pain, shortness of breath or palpitations.  He takes his medications regularly.  He exercises on a regular basis including aerobic and resistance training.   Past Medical History:  Diagnosis Date  . Chronic back pain   . Hyperlipidemia   . Hypertension   . Irregular heart beat     Past Surgical History:  Procedure Laterality Date  . BACK SURGERY       Current Outpatient Medications  Medication Sig Dispense Refill  . amLODipine (NORVASC) 10 MG tablet Take 1 tablet (10 mg total) by mouth daily. 90 tablet 3  . enalapril (VASOTEC) 20 MG tablet Take 1 tablet (20 mg total) by mouth daily. 90 tablet 3  . eplerenone (INSPRA) 50 MG tablet Take 1 tablet (50 mg total) by mouth daily. 90 tablet 3   No current facility-administered medications for this visit.     Allergies:   Patient has no known allergies.    Social History:  The patient  reports that he has been smoking cigars. He has never used smokeless tobacco. He reports that he drinks alcohol. He reports that he does not use drugs.    Family History:  The patient's Family history is unknown by patient.    ROS:  Please see the history of present illness.   Otherwise, review of systems are positive for none.   All other systems are reviewed and negative.    PHYSICAL EXAM: VS:  BP (!) 145/84 (BP Location: Left Arm, Patient Position: Sitting, Cuff Size: Large)   Pulse (!) 59   Ht 6' (1.829 m)   Wt 238 lb 4 oz (108.1 kg)   BMI 32.31 kg/m  , BMI Body mass index is 32.31 kg/m. GEN: Well nourished, well developed, in no acute distress  HEENT: normal  Neck: no JVD, carotid bruits, or masses Cardiac: RRR; no murmurs, rubs, or gallops,no edema  Respiratory:  clear to auscultation bilaterally, normal work of breathing GI: soft, nontender, nondistended, + BS MS: no deformity or atrophy  Skin: warm and dry, no rash Neuro:  Strength and sensation are intact Psych: euthymic mood, full affect   EKG:  EKG is ordered today. The ekg ordered today demonstrates sinus bradycardia with left anterior fascicular block.  No new changes.   Recent Labs: No results found for requested labs within last 8760 hours.    Lipid Panel    Component Value Date/Time   CHOL 152 09/08/2015 0918   TRIG 178 (H) 09/08/2015 0918   HDL 29 (L) 09/08/2015 0918   CHOLHDL 5.2 (H) 09/08/2015  1610   LDLCALC 87 09/08/2015 0918      Wt Readings from Last 3 Encounters:  06/04/18 238 lb 4 oz (108.1 kg)  06/20/16 241 lb 8 oz (109.5 kg)  09/06/15 231 lb 8 oz (105 kg)      No flowsheet data found.    ASSESSMENT AND PLAN:  1.  Essential hypertension: Blood pressure is mildly elevated .  However, I reviewed his home blood pressure readings which are excellent.  Continue same medications.  Check routine labs today.  2. Hyperlipidemia: I requested lipid and liver profile today.  3. Excessive alcohol use: I discussed with him the importance of cutting down on alcohol intake.   Disposition:   FU with me in 12 months  Signed,  Lorine Bears, MD  06/04/2018 5:25 PM    [facility]

## 2018-06-04 NOTE — Patient Instructions (Addendum)
Medication Instructions:  No changes  If you need a refill on your cardiac medications before your next appointment, please call your pharmacy.   Lab work: Your provider would like for you to have the following labs today: CBC, BMET, Liver and Lipid  If you have labs (blood work) drawn today and your tests are completely normal, you will receive your results only by: Marland Kitchen MyChart Message (if you have MyChart) OR . A paper copy in the mail If you have any lab test that is abnormal or we need to change your treatment, we will call you to review the results.  Follow-Up: At [facility], you and your health needs are our priority.  As part of our continuing mission to provide you with exceptional heart care, we have created designated Provider Care Teams.  These Care Teams include your primary Cardiologist (physician) and Advanced Practice Providers (APPs -  Physician Assistants and Nurse Practitioners) who all work together to provide you with the care you need, when you need it. You will need a follow up appointment in 12 months with Dr. Kirke Corin. Please call our office 2 months in advance to schedule this appointment.  You may see Dr. Kirke Corin or one of the following Advanced Practice Providers on your designated Care Team:   Nicolasa Ducking, NP Eula Listen, PA-C . Marisue Ivan, PA-C

## 2018-06-05 LAB — CBC
Hematocrit: 41.3 % (ref 37.5–51.0)
Hemoglobin: 15 g/dL (ref 13.0–17.7)
MCH: 34 pg — ABNORMAL HIGH (ref 26.6–33.0)
MCHC: 36.3 g/dL — ABNORMAL HIGH (ref 31.5–35.7)
MCV: 94 fL (ref 79–97)
PLATELETS: 216 10*3/uL (ref 150–450)
RBC: 4.41 x10E6/uL (ref 4.14–5.80)
RDW: 12 % — AB (ref 12.3–15.4)
WBC: 7.4 10*3/uL (ref 3.4–10.8)

## 2018-06-05 LAB — LIPID PANEL
Chol/HDL Ratio: 5.3 ratio — ABNORMAL HIGH (ref 0.0–5.0)
Cholesterol, Total: 169 mg/dL (ref 100–199)
HDL: 32 mg/dL — AB (ref >39)
LDL Calculated: 94 mg/dL (ref 0–99)
TRIGLYCERIDES: 215 mg/dL — AB (ref 0–149)
VLDL Cholesterol Cal: 43 mg/dL — ABNORMAL HIGH (ref 5–40)

## 2018-06-05 LAB — BASIC METABOLIC PANEL
BUN / CREAT RATIO: 19 (ref 9–20)
BUN: 21 mg/dL (ref 6–24)
CHLORIDE: 102 mmol/L (ref 96–106)
CO2: 20 mmol/L (ref 20–29)
CREATININE: 1.08 mg/dL (ref 0.76–1.27)
Calcium: 9.2 mg/dL (ref 8.7–10.2)
GFR calc Af Amer: 89 mL/min/{1.73_m2} (ref >59)
GFR calc non Af Amer: 77 mL/min/{1.73_m2} (ref >59)
GLUCOSE: 106 mg/dL — AB (ref 65–99)
Potassium: 3.9 mmol/L (ref 3.5–5.2)
SODIUM: 137 mmol/L (ref 134–144)

## 2018-06-05 LAB — HEPATIC FUNCTION PANEL
ALK PHOS: 77 IU/L (ref 39–117)
ALT: 33 IU/L (ref 0–44)
AST: 32 IU/L (ref 0–40)
Albumin: 4.9 g/dL (ref 3.5–5.5)
BILIRUBIN, DIRECT: 0.15 mg/dL (ref 0.00–0.40)
Bilirubin Total: 0.6 mg/dL (ref 0.0–1.2)
Total Protein: 7.4 g/dL (ref 6.0–8.5)

## 2019-02-03 ENCOUNTER — Other Ambulatory Visit: Payer: Self-pay | Admitting: Cardiovascular Disease

## 2019-02-03 MED ORDER — ENALAPRIL MALEATE 20 MG PO TABS: 20.0000 mg | ORAL_TABLET | Freq: Every day | ORAL | 3 refills | Status: DC

## 2019-02-03 MED ORDER — EPLERENONE 50 MG PO TABS: 50.0000 mg | ORAL_TABLET | Freq: Every day | ORAL | 2 refills | Status: DC

## 2019-02-03 MED ORDER — AMLODIPINE BESYLATE 5 MG PO TABS: 5.0000 mg | ORAL_TABLET | Freq: Every day | ORAL | 1 refills | Status: DC

## 2019-10-10 ENCOUNTER — Other Ambulatory Visit: Payer: Self-pay | Admitting: Cardiovascular Disease

## 2019-10-10 MED ORDER — EPLERENONE 50 MG PO TABS: 50.0000 mg | ORAL_TABLET | Freq: Every day | ORAL | 2 refills | Status: DC

## 2019-10-10 MED ORDER — ENALAPRIL MALEATE 20 MG PO TABS: 20.0000 mg | ORAL_TABLET | Freq: Every day | ORAL | 3 refills | Status: DC

## 2019-10-10 MED ORDER — AMLODIPINE BESYLATE 5 MG PO TABS: 5.0000 mg | ORAL_TABLET | Freq: Every day | ORAL | 1 refills | Status: DC

## 2020-05-08 ENCOUNTER — Other Ambulatory Visit: Payer: Self-pay | Admitting: Cardiovascular Disease

## 2020-05-10 NOTE — Telephone Encounter (Signed)
Patient I still overseas in [place]. Patient is waiting on immigration and it may take up to 6 months. Patient is unable to do a virtual visit to do no internet. Patients daughter is needing to send his medication to him at the end of the month

## 2020-05-10 NOTE — Telephone Encounter (Signed)
LVM for patient to call back and schedule

## 2020-05-10 NOTE — Telephone Encounter (Signed)
Yes we can refill his medications for another 6 months until he comes back.  I know that he has been stuck there.

## 2020-05-10 NOTE — Telephone Encounter (Signed)
See above

## 2020-05-10 NOTE — Telephone Encounter (Signed)
Pt overdue for 12 month f/u. °Please contact pt for future appointment. °

## 2020-10-12 ENCOUNTER — Telehealth: Payer: Self-pay | Admitting: *Deleted

## 2020-10-12 NOTE — Telephone Encounter (Signed)
Pt requiring PA for Eplerenone 50 mg tablet. PA has been processed via telephone. PA response 24-72 hrs.  GN#[phone] Pt ID: [id number]  Dx code:[id number] HTN

## 2020-10-14 NOTE — Telephone Encounter (Signed)
Pt has been approved for Eplerenone 50 mg tablet. 10/13/2020-10/12/21.

## 2020-11-25 ENCOUNTER — Other Ambulatory Visit: Payer: Self-pay | Admitting: Cardiovascular Disease

## 2020-11-25 NOTE — Telephone Encounter (Signed)
Please schedule overdue F/U appointment-last seen 05/2018. Thank you!

## 2020-12-13 ENCOUNTER — Other Ambulatory Visit: Payer: Self-pay | Admitting: Internal Medicine

## 2020-12-14 LAB — CBC WITH DIFFERENTIAL/PLATELET
Basophils Absolute: 0.1 10*3/uL (ref 0.0–0.2)
Basos: 1 %
EOS (ABSOLUTE): 0.4 10*3/uL (ref 0.0–0.4)
Eos: 6 %
Hematocrit: 44.1 % (ref 37.5–51.0)
Hemoglobin: 15.8 g/dL (ref 13.0–17.7)
Immature Grans (Abs): 0 10*3/uL (ref 0.0–0.1)
Immature Granulocytes: 0 %
Lymphocytes Absolute: 2.8 10*3/uL (ref 0.7–3.1)
Lymphs: 37 %
MCH: 34.1 pg — ABNORMAL HIGH (ref 26.6–33.0)
MCHC: 35.8 g/dL — ABNORMAL HIGH (ref 31.5–35.7)
MCV: 95 fL (ref 79–97)
Monocytes Absolute: 0.6 10*3/uL (ref 0.1–0.9)
Monocytes: 8 %
Neutrophils Absolute: 3.7 10*3/uL (ref 1.4–7.0)
Neutrophils: 48 %
Platelets: 223 10*3/uL (ref 150–450)
RBC: 4.63 x10E6/uL (ref 4.14–5.80)
RDW: 11.9 % (ref 11.6–15.4)
WBC: 7.7 10*3/uL (ref 3.4–10.8)

## 2020-12-14 LAB — LIPID PANEL W/O CHOL/HDL RATIO
Cholesterol, Total: 184 mg/dL (ref 100–199)
HDL: 31 mg/dL — ABNORMAL LOW (ref >39)
LDL Chol Calc (NIH): 110 mg/dL — ABNORMAL HIGH (ref 0–99)
Triglycerides: 244 mg/dL — ABNORMAL HIGH (ref 0–149)
VLDL Cholesterol Cal: 43 mg/dL — ABNORMAL HIGH (ref 5–40)

## 2020-12-14 LAB — COMPREHENSIVE METABOLIC PANEL
ALT: 40 IU/L (ref 0–44)
AST: 33 IU/L (ref 0–40)
Albumin/Globulin Ratio: 1.4 (ref 1.2–2.2)
Albumin: 4.6 g/dL (ref 3.8–4.9)
Alkaline Phosphatase: 75 IU/L (ref 44–121)
BUN/Creatinine Ratio: 18 (ref 9–20)
BUN: 20 mg/dL (ref 6–24)
Bilirubin Total: 0.5 mg/dL (ref 0.0–1.2)
CO2: 21 mmol/L (ref 20–29)
Calcium: 9.2 mg/dL (ref 8.7–10.2)
Chloride: 102 mmol/L (ref 96–106)
Creatinine, Ser: 1.11 mg/dL (ref 0.76–1.27)
Globulin, Total: 3.2 g/dL (ref 1.5–4.5)
Glucose: 96 mg/dL (ref 65–99)
Potassium: 4.3 mmol/L (ref 3.5–5.2)
Sodium: 139 mmol/L (ref 134–144)
Total Protein: 7.8 g/dL (ref 6.0–8.5)
eGFR: 77 mL/min/{1.73_m2} (ref >59)

## 2020-12-14 LAB — URINALYSIS, ROUTINE W REFLEX MICROSCOPIC
Bilirubin, UA: NEGATIVE
Glucose, UA: NEGATIVE
Nitrite, UA: NEGATIVE
RBC, UA: NEGATIVE
Specific Gravity, UA: 1.028 (ref 1.005–1.030)
Urobilinogen, Ur: 1 mg/dL (ref 0.2–1.0)
pH, UA: 6.5 (ref 5.0–7.5)

## 2020-12-14 LAB — T4, FREE: Free T4: 1.22 ng/dL (ref 0.82–1.77)

## 2020-12-14 LAB — PSA: Prostate Specific Ag, Serum: 1.1 ng/mL (ref 0.0–4.0)

## 2020-12-14 LAB — MICROSCOPIC EXAMINATION
Bacteria, UA: NONE SEEN
Casts: NONE SEEN /lpf
Epithelial Cells (non renal): NONE SEEN /hpf (ref 0–10)

## 2020-12-14 LAB — HGB A1C W/O EAG: Hgb A1c MFr Bld: 5 % (ref 4.8–5.6)

## 2020-12-14 LAB — TSH: TSH: 2.49 u[IU]/mL (ref 0.450–4.500)

## 2020-12-16 ENCOUNTER — Ambulatory Visit (INDEPENDENT_AMBULATORY_CARE_PROVIDER_SITE_OTHER): Payer: 59 | Admitting: Cardiovascular Disease

## 2020-12-16 ENCOUNTER — Encounter: Payer: Self-pay | Admitting: Cardiovascular Disease

## 2020-12-16 ENCOUNTER — Other Ambulatory Visit: Payer: Self-pay

## 2020-12-16 DIAGNOSIS — E785 Hyperlipidemia, unspecified: Secondary | ICD-10-CM

## 2020-12-16 DIAGNOSIS — I10 Essential (primary) hypertension: Secondary | ICD-10-CM

## 2020-12-16 MED ORDER — AMLODIPINE BESYLATE 5 MG PO TABS: ORAL_TABLET | ORAL | 3 refills | Status: DC

## 2020-12-16 NOTE — Patient Instructions (Signed)
Medication Instructions:  Your physician has recommended you make the following change in your medication:   INCREASE Amlodipine to 5 mg daily. An Rx has been sent to your pharmacy.   *If you need a refill on your cardiac medications before your next appointment, please call your pharmacy*   Lab Work: None ordered If you have labs (blood work) drawn today and your tests are completely normal, you will receive your results only by: Marland Kitchen MyChart Message (if you have MyChart) OR . A paper copy in the mail If you have any lab test that is abnormal or we need to change your treatment, we will call you to review the results.   Testing/Procedures: Dr. Kirke Corin has ordered a CT coronary calcium score. This test is done at [facility] [address] [place], [place] This is $99 out of pocket.  Please call [phone] to schedule  Coronary CalciumScan A coronary calcium scan is an imaging test used to look for deposits of calcium and other fatty materials (plaques) in the inner lining of the blood vessels of the heart (coronary arteries). These deposits of calcium and plaques can partly clog and narrow the coronary arteries without producing any symptoms or warning signs. This puts a person at risk for a heart attack. This test can detect these deposits before symptoms develop. Tell a health care provider about:  Any allergies you have.  All medicines you are taking, including vitamins, herbs, eye drops, creams, and over-the-counter medicines.  Any problems you or family members have had with anesthetic medicines.  Any blood disorders you have.  Any surgeries you have had.  Any medical conditions you have.  Whether you are pregnant or may be pregnant. What are the risks? Generally, this is a safe procedure. However, problems may occur, including:  Harm to a pregnant woman and her unborn baby. This test involves the use of radiation. Radiation exposure can be dangerous to a  pregnant woman and her unborn baby. If you are pregnant, you generally should not have this procedure done.  Slight increase in the risk of cancer. This is because of the radiation involved in the test. What happens before the procedure? No preparation is needed for this procedure. What happens during the procedure?  You will undress and remove any jewelry around your neck or chest.  You will put on a hospital gown.  Sticky electrodes will be placed on your chest. The electrodes will be connected to an electrocardiogram (ECG) machine to record a tracing of the electrical activity of your heart.  A CT scanner will take pictures of your heart. During this time, you will be asked to lie still and hold your breath for 2-3 seconds while a picture of your heart is being taken. The procedure may vary among health care providers and hospitals. What happens after the procedure?  You can get dressed.  You can return to your normal activities.  It is up to you to get the results of your test. Ask your health care provider, or the department that is doing the test, when your results will be ready. Summary  A coronary calcium scan is an imaging test used to look for deposits of calcium and other fatty materials (plaques) in the inner lining of the blood vessels of the heart (coronary arteries).  Generally, this is a safe procedure. Tell your health care provider if you are pregnant or may be pregnant.  No preparation is needed for this procedure.  A  CT scanner will take pictures of your heart.  You can return to your normal activities after the scan is done. This information is not intended to replace advice given to you by your health care provider. Make sure you discuss any questions you have with your health care provider. Document Released: 02/03/2008 Document Revised: 06/26/2016 Document Reviewed: 06/26/2016 Elsevier Interactive Patient Education  2017 [employer].     Follow-Up: At [facility], you and your health needs are our priority.  As part of our continuing mission to provide you with exceptional heart care, we have created designated Provider Care Teams.  These Care Teams include your primary Cardiologist (physician) and Advanced Practice Providers (APPs -  Physician Assistants and Nurse Practitioners) who all work together to provide you with the care you need, when you need it.  We recommend signing up for the patient portal called "MyChart".  Sign up information is provided on this After Visit Summary.  MyChart is used to connect with patients for Virtual Visits (Telemedicine).  Patients are able to view lab/test results, encounter notes, upcoming appointments, etc.  Non-urgent messages can be sent to your provider as well.   To learn more about what you can do with MyChart, go to [email or web address].    Your next appointment:   Your physician wants you to follow-up in: 1 year You will receive a reminder letter in the mail two months in advance. If you don't receive a letter, please call our office to schedule the follow-up appointment.   The format for your next appointment:   In Person  Provider:   You may see Lorine Bears, MD or one of the following Advanced Practice Providers on your designated Care Team:    Nicolasa Ducking, NP  Eula Listen, PA-C  Marisue Ivan, PA-C  Cadence Chelan, [place]  Gillian Shields, NP    Other Instructions N/A

## 2020-12-16 NOTE — Progress Notes (Signed)
Cardiology Office Note   Date:  12/16/2020   ID:  Michael Sims, DOB 1960/10/16, MRN [id number]  PCP:  Patient, No Pcp Per (Inactive)  Cardiologist:   Lorine Bears, MD   Chief Complaint  Patient presents with  . Follow-up    No complaints today. Meds reviewed verbally with pt.      History of Present Illness: Michael Sims is a 60 y.o. male who presents for a followup visit regarding hypertension. He is originally from [place]. He has prolonged history of hypertension requiring multiple medications. No history of ischemic heart disease, stroke or congestive heart failure. He had back surgery done in [place]. He does smoke cigars and drinks excessive alcohol mainly whiskey on a daily basis.  He had "noise in his ears" after  hydrochlorothiazide.  Carvedilol was not very effective and was discontinued in 2017.  He just returned from [place] after a long visit.  While he was there, he had an episode of abdominal pain with excessive sweating.  He had basic cardiac work-up with EKGs and troponin that was negative.  He has been taking his antihypertensive medications.  He continues to exercise on a regular basis for about 1 hour daily with no significant exertional symptoms.   Past Medical History:  Diagnosis Date  . Chronic back pain   . Hyperlipidemia   . Hypertension   . Irregular heart beat     Past Surgical History:  Procedure Laterality Date  . BACK SURGERY       Current Outpatient Medications  Medication Sig Dispense Refill  . amLODipine (NORVASC) 5 MG tablet TAKE 1 TABLET(5 MG) BY MOUTH DAILY 30 tablet 0  . enalapril (VASOTEC) 20 MG tablet TAKE 1 TABLET(20 MG) BY MOUTH DAILY 30 tablet 0  . eplerenone (INSPRA) 50 MG tablet Take 1 tablet (50 mg total) by mouth daily. 90 tablet 2   No current facility-administered medications for this visit.    Allergies:   Patient has no known allergies.    Social History:  The patient  reports that he has been smoking  cigars. He has never used smokeless tobacco. He reports current alcohol use. He reports that he does not use drugs.   Family History:  The patient's Family history is unknown by patient.    ROS:  Please see the history of present illness.   Otherwise, review of systems are positive for none.   All other systems are reviewed and negative.    PHYSICAL EXAM: VS:  BP (!) 158/100 (BP Location: Left Arm, Patient Position: Sitting, Cuff Size: Normal)   Pulse 61   Ht 5\' 11"  (1.803 m)   Wt 240 lb 4 oz (109 kg)   SpO2 98%   BMI 33.51 kg/m  , BMI Body mass index is 33.51 kg/m. GEN: Well nourished, well developed, in no acute distress  HEENT: normal  Neck: no JVD, carotid bruits, or masses Cardiac: RRR; no murmurs, rubs, or gallops,no edema  Respiratory:  clear to auscultation bilaterally, normal work of breathing GI: soft, nontender, nondistended, + BS MS: no deformity or atrophy  Skin: warm and dry, no rash Neuro:  Strength and sensation are intact Psych: euthymic mood, full affect   EKG:  EKG is ordered today. The ekg ordered today demonstrates sinus bradycardia with left anterior fascicular block.  No new changes.   Recent Labs: 12/13/2020: ALT 40; BUN 20; Creatinine, Ser 1.11; Hemoglobin 15.8; Platelets 223; Potassium 4.3; Sodium 139; TSH 2.490    Lipid Panel  Component Value Date/Time   CHOL 184 12/13/2020 0841   TRIG 244 (H) 12/13/2020 0841   HDL 31 (L) 12/13/2020 0841   CHOLHDL 5.3 (H) 06/04/2018 1607   LDLCALC 110 (H) 12/13/2020 0841      Wt Readings from Last 3 Encounters:  12/16/20 240 lb 4 oz (109 kg)  06/04/18 238 lb 4 oz (108.1 kg)  06/20/16 241 lb 8 oz (109.5 kg)      No flowsheet data found.    ASSESSMENT AND PLAN:  1.  Essential hypertension: Blood pressure is mildly elevated .  I increased amlodipine to 5 mg once daily.  Continue enalapril and eplerenone.  I reviewed his recent labs which were unremarkable.  I asked him to start monitoring blood  pressure at least 3 times a week to see if further adjustment is needed.  2.  Mixed hyperlipidemia: Recent lipid profile showed triglyceride of 244 and an LDL of 110.  Suspect that some of this is due to recent unhealthy diet and lifestyle. I recommend CT calcium score to see if he has evidence of atherosclerosis to justify treatment of hyperlipidemia with medication.  He is agreeable.  3. Excessive alcohol use: I discussed with him the importance of cutting down on alcohol intake.   Disposition:   FU with me in 12 months  Signed,  Lorine Bears, MD  12/16/2020 3:44 PM    [facility]

## 2020-12-17 NOTE — Addendum Note (Signed)
Addended by: Festus Aloe on: 12/17/2020 11:40 AM   Modules accepted: Orders

## 2021-01-03 ENCOUNTER — Other Ambulatory Visit: Payer: Self-pay | Admitting: Cardiovascular Disease

## 2021-01-07 ENCOUNTER — Other Ambulatory Visit: Payer: Self-pay

## 2021-01-07 ENCOUNTER — Ambulatory Visit
Admission: RE | Admit: 2021-01-07 | Discharge: 2021-01-07 | Disposition: A | Discharge status: Home / Self Care | Payer: 59 | Source: Ambulatory Visit | Attending: Cardiovascular Disease | Admitting: Cardiovascular Disease

## 2021-01-07 DIAGNOSIS — E785 Hyperlipidemia, unspecified: Secondary | ICD-10-CM | POA: Insufficient documentation

## 2021-01-13 ENCOUNTER — Telehealth: Payer: Self-pay

## 2021-01-13 DIAGNOSIS — E785 Hyperlipidemia, unspecified: Secondary | ICD-10-CM

## 2021-01-13 DIAGNOSIS — R911 Solitary pulmonary nodule: Secondary | ICD-10-CM

## 2021-01-13 MED ORDER — ROSUVASTATIN CALCIUM 20 MG PO TABS: 20.0000 mg | ORAL_TABLET | Freq: Every day | ORAL | 1 refills | Status: DC

## 2021-01-13 MED ORDER — ASPIRIN EC 81 MG PO TBEC: 81.0000 mg | DELAYED_RELEASE_TABLET | Freq: Every day | ORAL | Status: DC

## 2021-01-13 NOTE — Telephone Encounter (Signed)
DPR on file. Spoke with the patients daughter Neysa Bonito. Neysa Bonito made aware of the patients Cardiac CT calcium score and Dr. Jari Sportsman recommendation.  Start asa 81 mg daily Rx for rosuvastatin 20 mg daily sent to the patients pharamacy Lab orders for 6 week fasting lipid and lft to be drawn at [facility] mailed to the patient. F/U appt with Dr. Kirke Corin scheduled for 03/08/21. Order placed for 1 yr repeat Chest CT wo/contrast.

## 2021-01-13 NOTE — Telephone Encounter (Signed)
-----   Message from Iran Ouch, MD sent at 01/12/2021  5:59 PM EDT ----- I informed the patient's daughter of abnormal calcium score and small right pulmonary nodule. Recommend: Aspirin 81 mg daily.  Crestor 20 mg daily. Repeat lipid and liver profile in 6 weeks Schedule office follow up in 2 months Follow up non contrast ct chest in 1 year to follow up on pulmonary nodule.

## 2021-02-15 ENCOUNTER — Telehealth: Payer: Self-pay

## 2021-02-15 DIAGNOSIS — I10 Essential (primary) hypertension: Secondary | ICD-10-CM

## 2021-02-15 MED ORDER — ENALAPRIL MALEATE 20 MG PO TABS: ORAL_TABLET | ORAL | 1 refills | Status: DC

## 2021-02-15 MED ORDER — AMLODIPINE BESYLATE 10 MG PO TABS: ORAL_TABLET | ORAL | 1 refills | Status: DC

## 2021-02-15 NOTE — Telephone Encounter (Signed)
-----   Message from Iran Ouch, MD sent at 02/15/2021  2:19 PM EDT ----- His blood pressure is still not controlled.  Can you please refill all his medications and increase amlodipine to 10 mg once daily.? Thanks.

## 2021-03-08 ENCOUNTER — Other Ambulatory Visit: Payer: Self-pay

## 2021-03-08 ENCOUNTER — Encounter: Payer: Self-pay | Admitting: Cardiovascular Disease

## 2021-03-08 ENCOUNTER — Ambulatory Visit (INDEPENDENT_AMBULATORY_CARE_PROVIDER_SITE_OTHER): Payer: 59 | Admitting: Cardiovascular Disease

## 2021-03-08 VITALS — BP 138/80 | HR 55 | Ht 71.0 in | Wt 243.5 lb

## 2021-03-08 DIAGNOSIS — R911 Solitary pulmonary nodule: Secondary | ICD-10-CM

## 2021-03-08 DIAGNOSIS — I10 Essential (primary) hypertension: Secondary | ICD-10-CM

## 2021-03-08 DIAGNOSIS — Z7289 Other problems related to lifestyle: Secondary | ICD-10-CM

## 2021-03-08 DIAGNOSIS — E785 Hyperlipidemia, unspecified: Secondary | ICD-10-CM | POA: Diagnosis not present

## 2021-03-08 DIAGNOSIS — F1090 Alcohol use, unspecified, uncomplicated: Secondary | ICD-10-CM

## 2021-03-08 DIAGNOSIS — R931 Abnormal findings on diagnostic imaging of heart and coronary circulation: Secondary | ICD-10-CM

## 2021-03-08 DIAGNOSIS — Z789 Other specified health status: Secondary | ICD-10-CM

## 2021-03-08 MED ORDER — ROSUVASTATIN CALCIUM 20 MG PO TABS: 20.0000 mg | ORAL_TABLET | Freq: Every day | ORAL | 3 refills | Status: DC

## 2021-03-08 MED ORDER — EPLERENONE 50 MG PO TABS: ORAL_TABLET | ORAL | 3 refills | Status: DC

## 2021-03-08 MED ORDER — AMLODIPINE BESYLATE 10 MG PO TABS: ORAL_TABLET | ORAL | 3 refills | Status: DC

## 2021-03-08 MED ORDER — ENALAPRIL MALEATE 20 MG PO TABS: ORAL_TABLET | ORAL | 3 refills | Status: DC

## 2021-03-08 NOTE — Patient Instructions (Signed)
Medication Instructions:  Your physician recommends that you continue on your current medications as directed. Please refer to the Current Medication list given to you today.  Your medications have been refilled  *If you need a refill on your cardiac medications before your next appointment, please call your pharmacy*   Lab Work: Your physician recommends that you return for a FASTING lipid profile and hepatic panel:  Please have your labs drawn at the medical mall. No appt needed. Lab hours are Mon-Fri 7:30am-6pm.   If you have labs (blood work) drawn today and your tests are completely normal, you will receive your results only by: MyChart Message (if you have MyChart) OR A paper copy in the mail If you have any lab test that is abnormal or we need to change your treatment, we will call you to review the results.   Testing/Procedures: None ordered   Follow-Up: At [facility], you and your health needs are our priority.  As part of our continuing mission to provide you with exceptional heart care, we have created designated Provider Care Teams.  These Care Teams include your primary Cardiologist (physician) and Advanced Practice Providers (APPs -  Physician Assistants and Nurse Practitioners) who all work together to provide you with the care you need, when you need it.  We recommend signing up for the patient portal called "MyChart".  Sign up information is provided on this After Visit Summary.  MyChart is used to connect with patients for Virtual Visits (Telemedicine).  Patients are able to view lab/test results, encounter notes, upcoming appointments, etc.  Non-urgent messages can be sent to your provider as well.   To learn more about what you can do with MyChart, go to [email or web address].     Your physician wants you to follow-up in: 6 months You will receive a reminder letter in the mail two months in advance. If you don't receive a letter, please call our office to  schedule the follow-up appointment.     The format for your next appointment:   In Person  Provider:   You may see Lorine Bears, MD or one of the following Advanced Practice Providers on your designated Care Team:   Nicolasa Ducking, NP Eula Listen, PA-C Marisue Ivan, PA-C Cadence Fransico Michael, [place]   Other Instructions N/A

## 2021-03-08 NOTE — Progress Notes (Signed)
Cardiology Office Note   Date:  03/08/2021   ID:  Michael Sims, DOB 1960/11/14, MRN [id number]  PCP:  Patient, No Pcp Per (Inactive)  Cardiologist:   Lorine Bears, MD   Chief Complaint  Patient presents with   Other    1 month f/u no complaints today. Meds reviewed verbally with pt.      History of Present Illness: Michael Sims is a 60 y.o. male who presents for a followup visit regarding hypertension and elevated coronary calcium score. He is originally from [place]. He has prolonged history of hypertension requiring multiple medications. No history of ischemic heart disease, stroke or congestive heart failure. He had back surgery done in [place]. He does smoke cigars and drinks excessive alcohol mainly whiskey on a daily basis.  He had "noise in his ears" after  hydrochlorothiazide.  Carvedilol was not very effective and was discontinued in 2017.  CT calcium score was performed in May 2022.  His calcium score was 641 placing him at the 95 percentile for age and sex matched control.  There was also evidence of aortic atherosclerosis.  Noncardiac findings included an isolated right middle lobe pulmonary nodule of 3 mm. Based on this, I started him on aspirin 81 mg once daily as well as rosuvastatin 20 mg once daily.  He has been doing well with no chest pain, shortness of breath or palpitation.   Past Medical History:  Diagnosis Date   Chronic back pain    Hyperlipidemia    Hypertension    Irregular heart beat     Past Surgical History:  Procedure Laterality Date   BACK SURGERY       Current Outpatient Medications  Medication Sig Dispense Refill   amLODipine (NORVASC) 10 MG tablet TAKE 1 TABLET(5 MG) BY MOUTH DAILY 90 tablet 1   aspirin EC 81 MG tablet Take 1 tablet (81 mg total) by mouth daily. Swallow whole.     enalapril (VASOTEC) 20 MG tablet TAKE 1 TABLET(20 MG) BY MOUTH DAILY 90 tablet 1   eplerenone (INSPRA) 50 MG tablet TAKE 1 TABLET(50 MG) BY MOUTH DAILY  90 tablet 2   rosuvastatin (CRESTOR) 20 MG tablet Take 1 tablet (20 mg total) by mouth daily. 90 tablet 1   No current facility-administered medications for this visit.    Allergies:   Patient has no known allergies.    Social History:  The patient  reports that he has been smoking cigars. He has never used smokeless tobacco. He reports current alcohol use. He reports that he does not use drugs.   Family History:  The patient's Family history is unknown by patient.    ROS:  Please see the history of present illness.   Otherwise, review of systems are positive for none.   All other systems are reviewed and negative.    PHYSICAL EXAM: VS:  BP 138/80 (BP Location: Left Arm, Patient Position: Sitting, Cuff Size: Large)   Pulse (!) 55   Ht 5\' 11"  (1.803 m)   Wt 243 lb 8 oz (110.5 kg)   SpO2 97%   BMI 33.96 kg/m  , BMI Body mass index is 33.96 kg/m. GEN: Well nourished, well developed, in no acute distress  HEENT: normal  Neck: no JVD, carotid bruits, or masses Cardiac: RRR; no murmurs, rubs, or gallops,no edema  Respiratory:  clear to auscultation bilaterally, normal work of breathing GI: soft, nontender, nondistended, + BS MS: no deformity or atrophy  Skin: warm and dry, no  rash Neuro:  Strength and sensation are intact Psych: euthymic mood, full affect   EKG:  EKG is ordered today. The ekg ordered today demonstrates sinus bradycardia with left anterior fascicular block.  No new changes.   Recent Labs: 12/13/2020: ALT 40; BUN 20; Creatinine, Ser 1.11; Hemoglobin 15.8; Platelets 223; Potassium 4.3; Sodium 139; TSH 2.490    Lipid Panel    Component Value Date/Time   CHOL 184 12/13/2020 0841   TRIG 244 (H) 12/13/2020 0841   HDL 31 (L) 12/13/2020 0841   CHOLHDL 5.3 (H) 06/04/2018 1607   LDLCALC 110 (H) 12/13/2020 0841      Wt Readings from Last 3 Encounters:  03/08/21 243 lb 8 oz (110.5 kg)  12/16/20 240 lb 4 oz (109 kg)  06/04/18 238 lb 4 oz (108.1 kg)      No  flowsheet data found.    ASSESSMENT AND PLAN:  1.  Essential hypertension: Blood pressure is mildly elevated .  Blood pressure is reasonably controlled on amlodipine, enalapril and eplerenone.  These medications were refilled.  2.  Mixed hyperlipidemia: Recent lipid profile showed triglyceride of 244 and an LDL of 110.  He was started on rosuvastatin 20 mg once daily.  I requested fasting lipid and liver profile.  3. Excessive alcohol use: I discussed with him the importance of cutting down on alcohol intake.  4.  Elevated coronary calcium score: Currently with no anginal symptoms.  I advised him to stay on aspirin 81 mg daily and continue treatment of risk factors.  5.  Small right pulmonary nodules: Follow-up CT chest in May 2023.   Disposition:   FU with me in 6 months  Signed,  Lorine Bears, MD  03/08/2021 4:29 PM    [facility]

## 2021-08-08 ENCOUNTER — Telehealth: Payer: Self-pay

## 2021-08-08 DIAGNOSIS — I10 Essential (primary) hypertension: Secondary | ICD-10-CM

## 2021-08-08 DIAGNOSIS — E785 Hyperlipidemia, unspecified: Secondary | ICD-10-CM

## 2021-08-08 MED ORDER — ENALAPRIL MALEATE 20 MG PO TABS: ORAL_TABLET | ORAL | 1 refills | Status: DC

## 2021-08-08 MED ORDER — AMLODIPINE BESYLATE 10 MG PO TABS: ORAL_TABLET | ORAL | 1 refills | Status: DC

## 2021-08-08 MED ORDER — EPLERENONE 50 MG PO TABS: ORAL_TABLET | ORAL | 1 refills | Status: DC

## 2021-08-08 MED ORDER — ROSUVASTATIN CALCIUM 20 MG PO TABS: 20.0000 mg | ORAL_TABLET | Freq: Every day | ORAL | 1 refills | Status: DC

## 2021-08-08 MED ORDER — ASPIRIN EC 81 MG PO TBEC: 81.0000 mg | DELAYED_RELEASE_TABLET | Freq: Every day | ORAL | 1 refills | Status: AC

## 2021-08-08 NOTE — Telephone Encounter (Signed)
-----   Message from Iran Ouch, MD sent at 08/08/2021  7:44 AM EST ----- Can you please send the patient 90-day supply on all his medications?  [place] is traveling out of the [place] and needs the 90-day supply.  Thanks.

## 2021-08-08 NOTE — Telephone Encounter (Signed)
Sent 90 day supply on patients cardiac meds to patients pharmacy as requested.

## 2021-09-06 IMAGING — CT CT CARDIAC CORONARY ARTERY CALCIUM SCORE
4 series · 11 of 20 positions shown, 12 images · non-contrast
Comparison: None.

Addendum:
CLINICAL DATA: Cardiovascular Disease Risk stratification

EXAM:
Coronary Calcium Score
TECHNIQUE: A gated, non-contrast computed tomography scan of the heart was
performed using 3mm slice thickness. Axial images were analyzed on a
dedicated workstation. Calcium scoring of the coronary arteries was
performed using the Agatston method.
CLINICAL DATA: Risk stratification
TECHNIQUE: The patient was scanned on a Siemens Somatom go.Top Scanner. Axial
non-contrast 3 mm slices were carried out through the heart. The
data set was analyzed on a dedicated work station and scored using
the Agatson method.

[Series 2: sa36 calcium scoring 3.00 · axial · 0.45mm/px · z∈[-1130,-1064]mm · 3 of 46 slices shown, 4 images (1 of 2)]
[im 12/46  vessel]
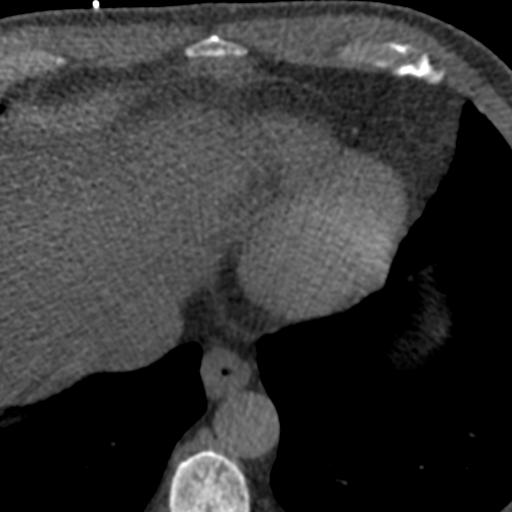
[im 12/46  lung]
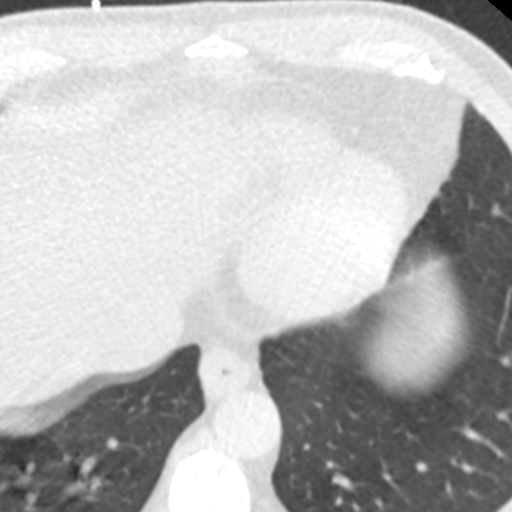
[im 23/46  vessel]
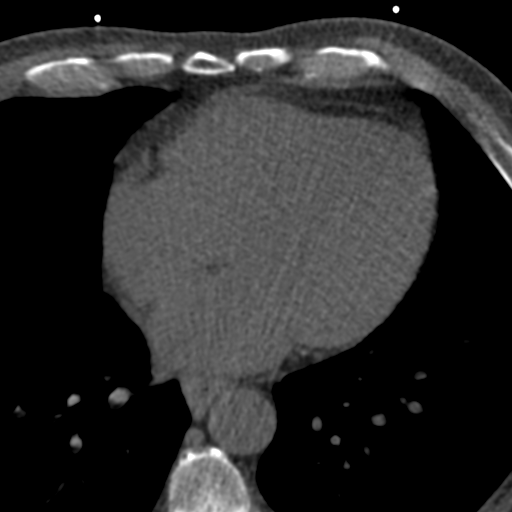
[im 34/46  vessel]
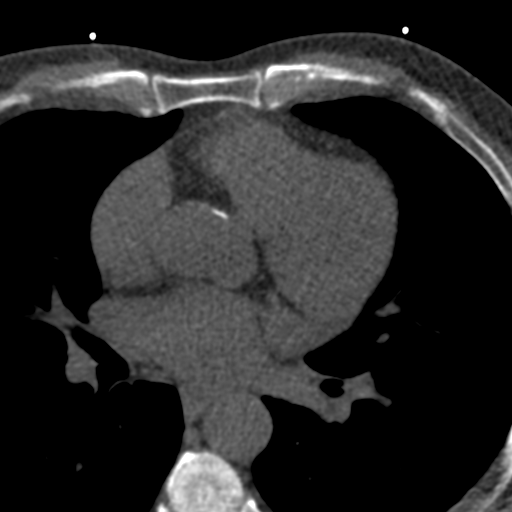

[Series 3: sa36 calcium scoring 3.00 · axial · 0.45mm/px · z∈[-1127,-1061]mm · 3 of 45 slices shown (2 of 2)]
[im 12/45  vessel]
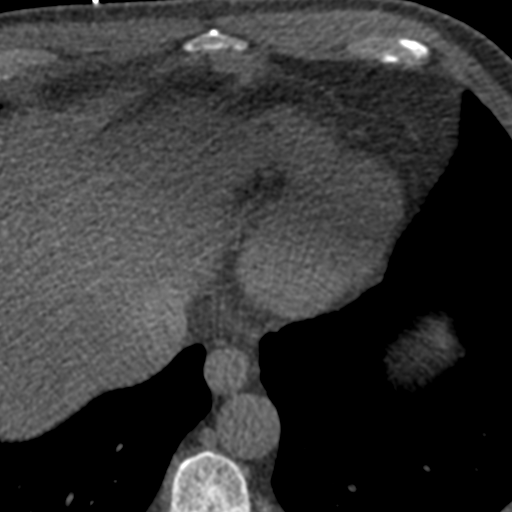
[im 23/45  vessel]
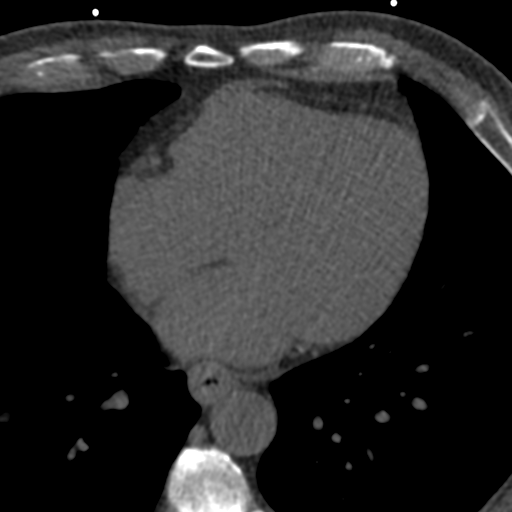
[im 34/45  vessel]
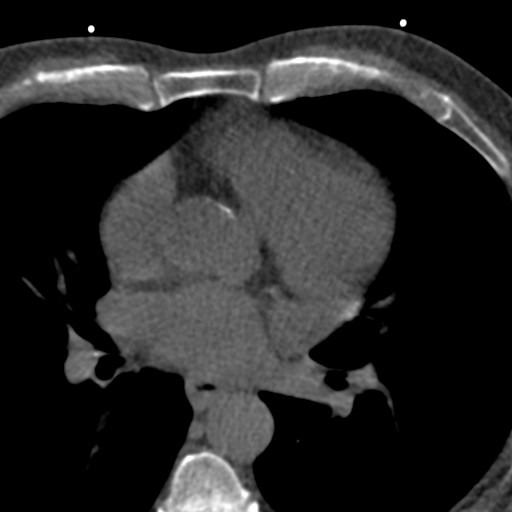

[Series 6: full fov st calcium scoring 3.00 · axial · 0.77mm/px · z∈[-1127,-1061]mm · 3 of 45 slices shown]
[im 12/45  vessel]
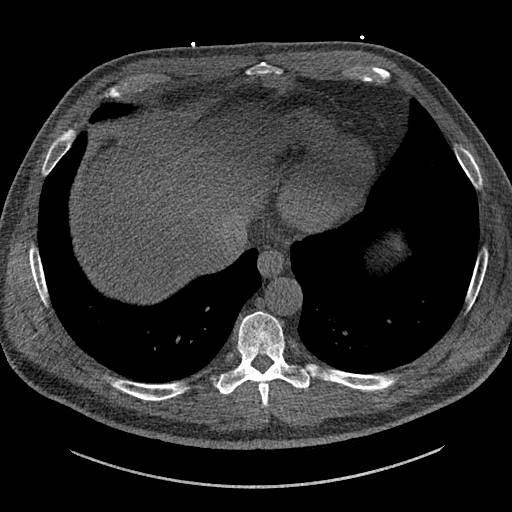
[im 23/45  vessel]
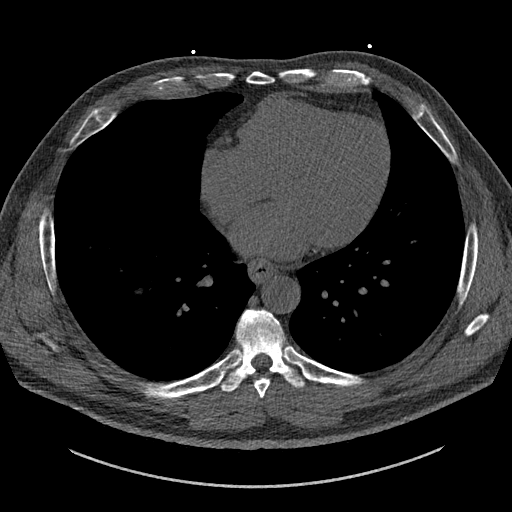
[im 34/45  vessel]
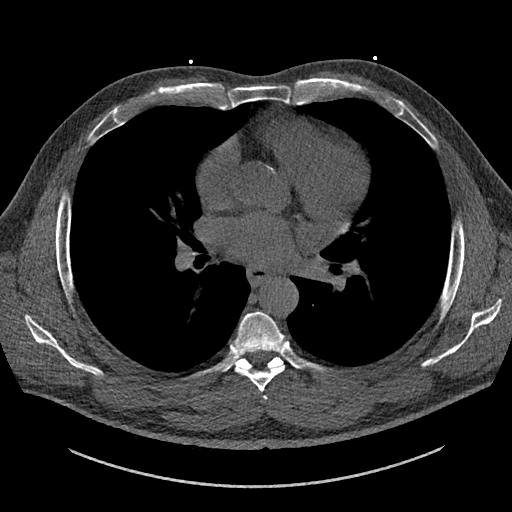

[Series 11: full fov lungs calcium scoring 3.00 ax · axial · 0.77mm/px · z∈[-1127,-1094]mm · 2 of 45 slices shown]
[im 12/45  vessel]
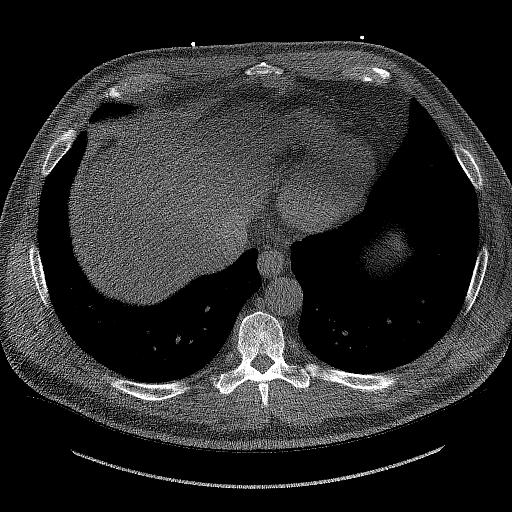
[im 23/45  vessel]
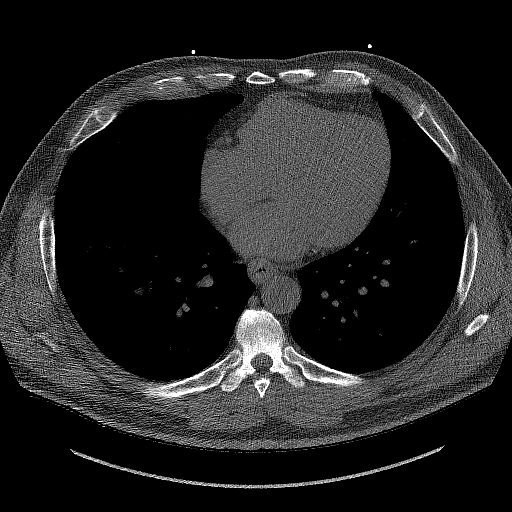

[11 of 20 positions shown; findings below may reference images not displayed]

FINDINGS: Coronary arteries: Normal origins.

Coronary Calcium Score:

Left main: 0

Left anterior descending artery: 351

Left circumflex artery: 257

Right coronary artery: 104

Total: 712

Percentile: 96 th

Pericardium: Normal.

Ascending Aorta: Normal caliber.

Non-cardiac: See separate report from [REDACTED].
IMPRESSION: Coronary calcium score of 712. This was 96 th percentile for age-,
race-, and sex-matched controls.



If CAC=0, it is reasonable to withhold statin therapy and reassess
in 5 to 10 years, as long as higher risk conditions are absent
(diabetes mellitus, family history of premature CHD in first degree
relatives (males <55 years; females <65 years), cigarette smoking,
or LDL >=190 mg/dL).

If CAC is 1 to 99, it is reasonable to initiate statin therapy for
patients >=55 years of age.

If CAC is >=100 or >=75th percentile, it is reasonable to initiate
statin therapy at any age.

Cardiology referral should be considered for patients with CAC
scores >=400 or >=75th percentile.

*8875 AHA/ACC/AACVPR/AAPA/ABC/AYAR/ZUBANI/DREY/Jankowska Burba/MAYABEL/ANUJ/TECCO
Guideline on the Management of Blood Cholesterol: A Report of the
American College of Cardiology/American Heart Association Task Force
on Clinical Practice Guidelines. J Am Coll Cardiol.
0313;73(24):8746-8390.
FINDINGS: Non-cardiac: See separate report from [REDACTED].

Ascending Aorta: Normal size, minimal aortic root and descending
aortic wall calcifications.

Pericardium: Normal

Coronary arteries: Normal origin of left and right coronary
arteries. Distribution of arterial calcifications if present, as
noted below;

LM

LAD 356

LCx

RCA 111

Total 641

IMPRESSION AND RECOMMENDATION:
1. Coronary calcium score of 641. This was 95th percentile for age
and sex matched control.

2. CAC >300 in LM, LAD, LCx, RCA. BAMYAN TIGER3/N4.

3. Recommend asa and statin if no contraindications

4. Continue heart healthy lifestyle and risk factor modification.

Pauu Bartel

EXAM:
OVER-READ INTERPRETATION  CT CHEST

The following report is an over-read performed by radiologist Dr.
Thaelo Jaars [REDACTED] on 01/10/2021. This over-read
does not include interpretation of cardiac or coronary anatomy or
pathology. The calcium score interpretation by the cardiologist is
attached.
FINDINGS: Vascular: Aortic atherosclerosis.

Mediastinum/Nodes: No imaged thoracic adenopathy. Suspect mild
distal esophageal wall thickening, including on [DATE].

Lungs/Pleura: No pleural fluid. Mild centrilobular emphysema. An
isolated right middle lobe pulmonary nodule measures 3 mm on [DATE].

Upper Abdomen: Moderate to marked hepatic steatosis. Normal imaged
portions of the spleen, stomach.

Musculoskeletal: No acute osseous abnormality.
IMPRESSION: 1. No acute process in the imaged extracardiac chest.
2. Suspect mild distal esophageal wall thickening, as can be seen
with esophagitis.
3. Isolated right middle lobe pulmonary nodule of 3 mm. No follow-up
needed if patient is low-risk. Non-contrast chest CT can be
considered in 12 months if patient is high-risk. This recommendation
follows the consensus statement: Guidelines for Management of
Incidental Pulmonary Nodules Detected on CT Images: From the
4. Aortic Atherosclerosis (TZVR1-6GE.E) and Emphysema (TZVR1-50Z.M).

*** End of Addendum ***
Addendum:
FINDINGS: Coronary arteries: Normal origins.

Coronary Calcium Score:

Left main: 0

Left anterior descending artery: 351

Left circumflex artery: 257

Right coronary artery: 104

Total: 712

Percentile: 96 th

Pericardium: Normal.

Ascending Aorta: Normal caliber.

Non-cardiac: See separate report from [REDACTED].
IMPRESSION: Coronary calcium score of 712. This was 96 th percentile for age-,
race-, and sex-matched controls.



If CAC=0, it is reasonable to withhold statin therapy and reassess
in 5 to 10 years, as long as higher risk conditions are absent
(diabetes mellitus, family history of premature CHD in first degree
relatives (males <55 years; females <65 years), cigarette smoking,
or LDL >=190 mg/dL).

If CAC is 1 to 99, it is reasonable to initiate statin therapy for
patients >=55 years of age.

If CAC is >=100 or >=75th percentile, it is reasonable to initiate
statin therapy at any age.

Cardiology referral should be considered for patients with CAC
scores >=400 or >=75th percentile.

*8875 AHA/ACC/AACVPR/AAPA/ABC/AYAR/ZUBANI/DREY/Jankowska Burba/MAYABEL/ANUJ/TECCO
Guideline on the Management of Blood Cholesterol: A Report of the
American College of Cardiology/American Heart Association Task Force
on Clinical Practice Guidelines. J Am Coll Cardiol.
0313;73(24):8746-8390.
FINDINGS: Non-cardiac: See separate report from [REDACTED].

Ascending Aorta: Normal size, minimal aortic root and descending
aortic wall calcifications.

Pericardium: Normal

Coronary arteries: Normal origin of left and right coronary
arteries. Distribution of arterial calcifications if present, as
noted below;

LM

LAD 356

LCx

RCA 111

Total 641

IMPRESSION AND RECOMMENDATION:
1. Coronary calcium score of 641. This was 95th percentile for age
and sex matched control.

2. CAC >300 in LM, LAD, LCx, RCA. BAMYAN TIGER3/N4.

3. Recommend asa and statin if no contraindications

4. Continue heart healthy lifestyle and risk factor modification.

Pauu Bartel

*** End of Addendum ***
FINDINGS: Coronary arteries: Normal origins.

Coronary Calcium Score:

Left main: 0

Left anterior descending artery: 351

Left circumflex artery: 257

Right coronary artery: 104

Total: 712

Percentile: 96 th

Pericardium: Normal.

Ascending Aorta: Normal caliber.

Non-cardiac: See separate report from [REDACTED].
IMPRESSION: Coronary calcium score of 712. This was 96 th percentile for age-,
race-, and sex-matched controls.



If CAC=0, it is reasonable to withhold statin therapy and reassess
in 5 to 10 years, as long as higher risk conditions are absent
(diabetes mellitus, family history of premature CHD in first degree
relatives (males <55 years; females <65 years), cigarette smoking,
or LDL >=190 mg/dL).

If CAC is 1 to 99, it is reasonable to initiate statin therapy for
patients >=55 years of age.

If CAC is >=100 or >=75th percentile, it is reasonable to initiate
statin therapy at any age.

Cardiology referral should be considered for patients with CAC
scores >=400 or >=75th percentile.

*8875 AHA/ACC/AACVPR/AAPA/ABC/AYAR/ZUBANI/DREY/Jank[place]ska Burba/MAYABEL/ANUJ/TECCO
Guideline on the Management of Blood Cholesterol: A Report of the
American College of Cardiology/American Heart Association Task Force
on Clinical Practice Guidelines. J Am Coll Cardiol.
0313;73(24):8746-8390.

## 2021-12-20 ENCOUNTER — Other Ambulatory Visit: Payer: Self-pay | Admitting: Cardiovascular Disease

## 2021-12-21 ENCOUNTER — Other Ambulatory Visit: Payer: Self-pay | Admitting: Cardiovascular Disease

## 2021-12-21 DIAGNOSIS — I10 Essential (primary) hypertension: Secondary | ICD-10-CM

## 2021-12-21 DIAGNOSIS — E785 Hyperlipidemia, unspecified: Secondary | ICD-10-CM

## 2021-12-21 MED ORDER — ENALAPRIL MALEATE 20 MG PO TABS: ORAL_TABLET | ORAL | 1 refills | Status: DC

## 2021-12-21 MED ORDER — AMLODIPINE BESYLATE 10 MG PO TABS
ORAL_TABLET | ORAL | 1 refills | Status: DC
Start: 2021-12-21 — End: 2022-06-23

## 2021-12-21 MED ORDER — ROSUVASTATIN CALCIUM 20 MG PO TABS: 20.0000 mg | ORAL_TABLET | Freq: Every day | ORAL | 1 refills | Status: DC

## 2021-12-21 MED ORDER — EPLERENONE 50 MG PO TABS: ORAL_TABLET | ORAL | 1 refills | Status: DC

## 2021-12-24 ENCOUNTER — Other Ambulatory Visit: Payer: Self-pay | Admitting: Cardiovascular Disease

## 2021-12-24 DIAGNOSIS — I10 Essential (primary) hypertension: Secondary | ICD-10-CM

## 2021-12-24 DIAGNOSIS — E785 Hyperlipidemia, unspecified: Secondary | ICD-10-CM

## 2021-12-29 ENCOUNTER — Ambulatory Visit: Payer: Self-pay | Admitting: Cardiovascular Disease

## 2021-12-29 NOTE — Progress Notes (Deleted)
Cardiology Office Note   Date:  12/29/2021   ID:  Michael Sims, DOB Apr 22, 1961, MRN [id number]  PCP:  Patient, No Pcp Per (Inactive)  Cardiologist:   Lorine Bears, MD   No chief complaint on file.     History of Present Illness: Michael Sims is a 61 y.o. male who presents for a followup visit regarding hypertension and elevated coronary calcium score. He is originally from [place]. He has prolonged history of hypertension requiring multiple medications. No history of ischemic heart disease, stroke or congestive heart failure. He had back surgery done in [place]. He does smoke cigars and drinks excessive alcohol mainly whiskey on a daily basis.  He had "noise in his ears" after  hydrochlorothiazide.  Carvedilol was not very effective and was discontinued in 2017.  CT calcium score was performed in May 2022.  His calcium score was 641 placing him at the 95 percentile for age and sex matched control.  There was also evidence of aortic atherosclerosis.  Noncardiac findings included an isolated right middle lobe pulmonary nodule of 3 mm. Based on this, I started him on aspirin 81 mg once daily as well as rosuvastatin 20 mg once daily.  He has been doing well with no chest pain, shortness of breath or palpitation.   Past Medical History:  Diagnosis Date   Chronic back pain    Hyperlipidemia    Hypertension    Irregular heart beat     Past Surgical History:  Procedure Laterality Date   BACK SURGERY       Current Outpatient Medications  Medication Sig Dispense Refill   amLODipine (NORVASC) 10 MG tablet TAKE 1 TABLET(5 MG) BY MOUTH DAILY 90 tablet 1   aspirin EC 81 MG tablet Take 1 tablet (81 mg total) by mouth daily. Swallow whole. 90 tablet 1   enalapril (VASOTEC) 20 MG tablet TAKE 1 TABLET(20 MG) BY MOUTH DAILY 90 tablet 1   eplerenone (INSPRA) 50 MG tablet TAKE 1 TABLET(50 MG) BY MOUTH DAILY 90 tablet 1   rosuvastatin (CRESTOR) 20 MG tablet Take 1 tablet (20 mg total)  by mouth daily. 90 tablet 1   No current facility-administered medications for this visit.    Allergies:   Patient has no known allergies.    Social History:  The patient  reports that he has been smoking cigars. He has never used smokeless tobacco. He reports current alcohol use. He reports that he does not use drugs.   Family History:  The patient's Family history is unknown by patient.    ROS:  Please see the history of present illness.   Otherwise, review of systems are positive for none.   All other systems are reviewed and negative.    PHYSICAL EXAM: VS:  There were no vitals taken for this visit. , BMI There is no height or weight on file to calculate BMI. GEN: Well nourished, well developed, in no acute distress  HEENT: normal  Neck: no JVD, carotid bruits, or masses Cardiac: RRR; no murmurs, rubs, or gallops,no edema  Respiratory:  clear to auscultation bilaterally, normal work of breathing GI: soft, nontender, nondistended, + BS MS: no deformity or atrophy  Skin: warm and dry, no rash Neuro:  Strength and sensation are intact Psych: euthymic mood, full affect   EKG:  EKG is ordered today. The ekg ordered today demonstrates sinus bradycardia with left anterior fascicular block.  No new changes.   Recent Labs: No results found for requested labs within  last 8760 hours.    Lipid Panel    Component Value Date/Time   CHOL 184 12/13/2020 0841   TRIG 244 (H) 12/13/2020 0841   HDL 31 (L) 12/13/2020 0841   CHOLHDL 5.3 (H) 06/04/2018 1607   LDLCALC 110 (H) 12/13/2020 0841      Wt Readings from Last 3 Encounters:  03/08/21 243 lb 8 oz (110.5 kg)  12/16/20 240 lb 4 oz (109 kg)  06/04/18 238 lb 4 oz (108.1 kg)          View : No data to display.            ASSESSMENT AND PLAN:  1.  Essential hypertension: Blood pressure is mildly elevated .  Blood pressure is reasonably controlled on amlodipine, enalapril and eplerenone.  These medications were  refilled.  2.  Mixed hyperlipidemia: Recent lipid profile showed triglyceride of 244 and an LDL of 110.  He was started on rosuvastatin 20 mg once daily.  I requested fasting lipid and liver profile.  3. Excessive alcohol use: I discussed with him the importance of cutting down on alcohol intake.  4.  Elevated coronary calcium score: Currently with no anginal symptoms.  I advised him to stay on aspirin 81 mg daily and continue treatment of risk factors.  5.  Small right pulmonary nodules: Follow-up CT chest in May 2023.   Disposition:   FU with me in 6 months  Signed,  Lorine Bears, MD  12/29/2021 1:05 PM    [facility]

## 2021-12-30 ENCOUNTER — Encounter: Payer: Self-pay | Admitting: Cardiovascular Disease

## 2022-06-23 ENCOUNTER — Other Ambulatory Visit: Payer: Self-pay | Admitting: Cardiovascular Disease

## 2022-06-23 DIAGNOSIS — I10 Essential (primary) hypertension: Secondary | ICD-10-CM

## 2022-06-23 DIAGNOSIS — E785 Hyperlipidemia, unspecified: Secondary | ICD-10-CM

## 2022-06-23 NOTE — Telephone Encounter (Signed)
Patient is scheduled 08/31/22

## 2022-06-23 NOTE — Telephone Encounter (Signed)
Good Morning,  Could you schedule this patient an over due 6 month follow up visit? The patient was last seen by Dr. Fletcher Anon on 03-08-21. Thank you so much.

## 2022-06-25 ENCOUNTER — Other Ambulatory Visit: Payer: Self-pay | Admitting: Cardiovascular Disease

## 2022-06-26 ENCOUNTER — Other Ambulatory Visit: Payer: Self-pay

## 2022-06-26 DIAGNOSIS — E785 Hyperlipidemia, unspecified: Secondary | ICD-10-CM

## 2022-06-26 MED ORDER — ROSUVASTATIN CALCIUM 20 MG PO TABS: ORAL_TABLET | ORAL | 0 refills | Status: DC

## 2022-06-26 NOTE — Telephone Encounter (Signed)
Last seen 02/2021. 6 month F/U advised. Scheduled for 08/2022. Please advise if OK to refill, patient should be seen sooner by APP, or defer to PCP until seen in office. Thank you!

## 2022-07-28 ENCOUNTER — Other Ambulatory Visit: Payer: Self-pay | Admitting: Cardiovascular Disease

## 2022-07-28 DIAGNOSIS — E785 Hyperlipidemia, unspecified: Secondary | ICD-10-CM

## 2022-07-28 DIAGNOSIS — I10 Essential (primary) hypertension: Secondary | ICD-10-CM

## 2022-08-09 ENCOUNTER — Other Ambulatory Visit: Payer: Self-pay | Admitting: Cardiovascular Disease

## 2022-08-09 DIAGNOSIS — E785 Hyperlipidemia, unspecified: Secondary | ICD-10-CM

## 2022-08-09 DIAGNOSIS — I10 Essential (primary) hypertension: Secondary | ICD-10-CM

## 2022-08-31 ENCOUNTER — Ambulatory Visit: Payer: Self-pay | Attending: Cardiovascular Disease | Admitting: Cardiovascular Disease

## 2022-08-31 ENCOUNTER — Encounter: Payer: Self-pay | Admitting: Cardiovascular Disease

## 2022-09-19 ENCOUNTER — Other Ambulatory Visit: Payer: Self-pay | Admitting: *Deleted

## 2022-09-19 DIAGNOSIS — I10 Essential (primary) hypertension: Secondary | ICD-10-CM

## 2022-09-19 DIAGNOSIS — E785 Hyperlipidemia, unspecified: Secondary | ICD-10-CM

## 2022-09-19 NOTE — Addendum Note (Signed)
Addended by: Ricci Barker on: 09/19/2022 02:22 PM   Modules accepted: Orders

## 2022-09-20 ENCOUNTER — Other Ambulatory Visit: Payer: Self-pay | Admitting: Cardiovascular Disease

## 2022-09-20 DIAGNOSIS — I10 Essential (primary) hypertension: Secondary | ICD-10-CM | POA: Diagnosis not present

## 2022-09-20 DIAGNOSIS — E785 Hyperlipidemia, unspecified: Secondary | ICD-10-CM | POA: Diagnosis not present

## 2022-09-20 NOTE — Progress Notes (Unsigned)
Cardiology Office Note   Date:  09/21/2022   ID:  Michael Sims, DOB [birth year], MRN [id number]  PCP:  Patient, No Pcp Per  Cardiologist:   Kathlyn Sacramento, MD   Chief Complaint  Patient presents with   Follow-up    Follow up. Patient is doing well today with no concerns or complaints although patient's daughter states that the patient had gotten dizzy in the elevator.  Meds reviewed.       History of Present Illness: Michael Sims is a 62 y.o. male who presents for a followup visit regarding hypertension and elevated coronary calcium score. He is originally from [place]. He has prolonged history of hypertension requiring multiple medications. No history of ischemic heart disease, stroke or congestive heart failure. He had back surgery done in [place]. He does smoke cigars and drinks excessive alcohol mainly whiskey on a daily basis.  He had "noise in his ears" after  hydrochlorothiazide.  Carvedilol was not very effective and was discontinued in 2017.  CT calcium score was performed in May 2022.  His calcium score was 641 placing him at the 95 percentile for age and sex matched control.  There was also evidence of aortic atherosclerosis.  Noncardiac findings included an isolated right middle lobe pulmonary nodule of 3 mm.  He has been doing very well with no recent chest pain, shortness of breath or palpitations.  He cut down slightly on whiskey drinking.  He drinks 1 gallon every 4 days instead of every 3 days.  He smokes 1 to 2 cigars daily.  Past Medical History:  Diagnosis Date   Chronic back pain    Hyperlipidemia    Hypertension    Irregular heart beat     Past Surgical History:  Procedure Laterality Date   BACK SURGERY       Current Outpatient Medications  Medication Sig Dispense Refill   amLODipine (NORVASC) 10 MG tablet TAKE 1 TABLET BY MOUTH EVERY DAY 90 tablet 0   aspirin EC 81 MG tablet Take 1 tablet (81 mg total) by mouth daily. Swallow whole. 90 tablet 1    eplerenone (INSPRA) 50 MG tablet TAKE 1 TABLET(50 MG) BY MOUTH DAILY 90 tablet 0   rosuvastatin (CRESTOR) 20 MG tablet TAKE 1 TABLET(20 MG) BY MOUTH DAILY 90 tablet 0   valsartan (DIOVAN) 320 MG tablet Take 1 tablet (320 mg total) by mouth daily. 90 tablet 3   No current facility-administered medications for this visit.    Allergies:   Patient has no known allergies.    Social History:  The patient  reports that he has been smoking cigars. He has never used smokeless tobacco. He reports current alcohol use. He reports that he does not use drugs.   Family History:  The patient's Family history is unknown by patient.    ROS:  Please see the history of present illness.   Otherwise, review of systems are positive for none.   All other systems are reviewed and negative.    PHYSICAL EXAM: VS:  BP (!) 164/86 (BP Location: Left Arm, Patient Position: Sitting)   Pulse 65   Ht 5\' 11"  (1.803 m)   Wt 248 lb 6.4 oz (112.7 kg)   SpO2 97%   BMI 34.64 kg/m  , BMI Body mass index is 34.64 kg/m. GEN: Well nourished, well developed, in no acute distress  HEENT: normal  Neck: no JVD, carotid bruits, or masses Cardiac: RRR; no murmurs, rubs, or gallops,no edema  Respiratory:  clear to auscultation bilaterally, normal work of breathing GI: soft, nontender, nondistended, + BS MS: no deformity or atrophy  Skin: warm and dry, no rash Neuro:  Strength and sensation are intact Psych: euthymic mood, full affect   EKG:  EKG is ordered today. The ekg ordered today demonstrates normal sinus rhythm with left anterior fascicular block and poor R wave progression in the inferior leads.  Recent Labs: 09/20/2022: ALT 34; BUN 19; Creatinine, Ser 0.99; Hemoglobin 14.5; Platelets 204; Potassium 4.0; Sodium 139    Lipid Panel    Component Value Date/Time   CHOL 119 09/20/2022 0840   TRIG 243 (H) 09/20/2022 0840   HDL 31 (L) 09/20/2022 0840   CHOLHDL 3.8 09/20/2022 0840   LDLCALC 49 09/20/2022 0840       Wt Readings from Last 3 Encounters:  09/21/22 248 lb 6.4 oz (112.7 kg)  03/08/21 243 lb 8 oz (110.5 kg)  12/16/20 240 lb 4 oz (109 kg)          No data to display            ASSESSMENT AND PLAN:  1.  Essential hypertension: Blood pressure is mildly elevated .  His recent labs were unremarkable.  I elected to switch enalapril to valsartan 320 mg once daily.  I asked him to monitor blood pressure at home over the next few weeks.  2.  Mixed hyperlipidemia: I reviewed recent lipid profile with him which showed improvement in LDL which was down to 49.  Continue current dose of rosuvastatin.  He does have elevated triglyceride at 243.  I discussed with him the importance of cutting down on alcohol use.  3. Excessive alcohol use: I discussed with him the importance of cutting down on alcohol intake.  4.  Elevated coronary calcium score: Currently with no anginal symptoms.  Continue aspirin 81 mg daily.  5.  Small right pulmonary nodules: I requested a follow-up CT scan to ensure stability given that he is a cigar smoker.   Disposition:   FU with me in 12 months  Signed,  Kathlyn Sacramento, MD  09/21/2022 10:54 AM    [place]

## 2022-09-21 ENCOUNTER — Encounter: Payer: Self-pay | Admitting: Cardiovascular Disease

## 2022-09-21 ENCOUNTER — Ambulatory Visit: Payer: 59 | Attending: Cardiovascular Disease | Admitting: Cardiovascular Disease

## 2022-09-21 VITALS — BP 164/86 | HR 65 | Ht 71.0 in | Wt 248.4 lb

## 2022-09-21 DIAGNOSIS — E785 Hyperlipidemia, unspecified: Secondary | ICD-10-CM

## 2022-09-21 DIAGNOSIS — R931 Abnormal findings on diagnostic imaging of heart and coronary circulation: Secondary | ICD-10-CM | POA: Diagnosis not present

## 2022-09-21 DIAGNOSIS — Z789 Other specified health status: Secondary | ICD-10-CM

## 2022-09-21 DIAGNOSIS — I10 Essential (primary) hypertension: Secondary | ICD-10-CM

## 2022-09-21 DIAGNOSIS — R911 Solitary pulmonary nodule: Secondary | ICD-10-CM

## 2022-09-21 LAB — LIPID PANEL
Chol/HDL Ratio: 3.8 ratio (ref 0.0–5.0)
Cholesterol, Total: 119 mg/dL (ref 100–199)
HDL: 31 mg/dL — ABNORMAL LOW (ref >39)
LDL Chol Calc (NIH): 49 mg/dL (ref 0–99)
Triglycerides: 243 mg/dL — ABNORMAL HIGH (ref 0–149)
VLDL Cholesterol Cal: 39 mg/dL (ref 5–40)

## 2022-09-21 LAB — CBC
Hematocrit: 41.3 % (ref 37.5–51.0)
Hemoglobin: 14.5 g/dL (ref 13.0–17.7)
MCH: 33.1 pg — ABNORMAL HIGH (ref 26.6–33.0)
MCHC: 35.1 g/dL (ref 31.5–35.7)
MCV: 94 fL (ref 79–97)
Platelets: 204 10*3/uL (ref 150–450)
RBC: 4.38 x10E6/uL (ref 4.14–5.80)
RDW: 12.3 % (ref 11.6–15.4)
WBC: 6.5 10*3/uL (ref 3.4–10.8)

## 2022-09-21 LAB — COMPREHENSIVE METABOLIC PANEL
ALT: 34 IU/L (ref 0–44)
AST: 39 IU/L (ref 0–40)
Albumin/Globulin Ratio: 1.6 (ref 1.2–2.2)
Albumin: 4.6 g/dL (ref 3.8–4.9)
Alkaline Phosphatase: 82 IU/L (ref 44–121)
BUN/Creatinine Ratio: 19 (ref 10–24)
BUN: 19 mg/dL (ref 8–27)
Bilirubin Total: 0.4 mg/dL (ref 0.0–1.2)
CO2: 20 mmol/L (ref 20–29)
Calcium: 8.8 mg/dL (ref 8.6–10.2)
Chloride: 103 mmol/L (ref 96–106)
Creatinine, Ser: 0.99 mg/dL (ref 0.76–1.27)
Globulin, Total: 2.9 g/dL (ref 1.5–4.5)
Glucose: 94 mg/dL (ref 70–99)
Potassium: 4 mmol/L (ref 3.5–5.2)
Sodium: 139 mmol/L (ref 134–144)
Total Protein: 7.5 g/dL (ref 6.0–8.5)
eGFR: 87 mL/min/{1.73_m2} (ref >59)

## 2022-09-21 MED ORDER — VALSARTAN 320 MG PO TABS: 320.0000 mg | ORAL_TABLET | Freq: Every day | ORAL | 3 refills | Status: DC

## 2022-09-21 NOTE — Patient Instructions (Signed)
Medication Instructions:  STOP the Enalapril  START Valsartan 320 mg once daily  *If you need a refill on your cardiac medications before your next appointment, please call your pharmacy*   Lab Work: None ordered If you have labs (blood work) drawn today and your tests are completely normal, you will receive your results only by: [place] (if you have MyChart) OR A paper copy in the mail If you have any lab test that is abnormal or we need to change your treatment, we will call you to review the results.   Testing/Procedures: Dr. Fletcher Anon has ordered a CT of the Lung. This will be done at: [facility] [address] [place], [place] [address]   Follow-Up: At [facility], you and your health needs are our priority.  As part of our continuing mission to provide you with exceptional heart care, we have created designated Provider Care Teams.  These Care Teams include your primary Cardiologist (physician) and Advanced Practice Providers (APPs -  Physician Assistants and Nurse Practitioners) who all work together to provide you with the care you need, when you need it.  We recommend signing up for the patient portal called "MyChart".  Sign up information is provided on this After Visit Summary.  MyChart is used to connect with patients for Virtual Visits (Telemedicine).  Patients are able to view lab/test results, encounter notes, upcoming appointments, etc.  Non-urgent messages can be sent to your provider as well.   To learn more about what you can do with MyChart, go to [email or web address].    Your next appointment:   12 month(s)  Provider:   You may see Dr. Fletcher Anon or one of the following Advanced Practice Providers on your designated Care Team:   Murray Hodgkins, NP Christell Faith, PA-C Cadence Kathlen Mody, PA-C Gerrie Nordmann, NP

## 2022-09-26 ENCOUNTER — Other Ambulatory Visit: Payer: Self-pay | Admitting: Cardiovascular Disease

## 2022-09-28 NOTE — Addendum Note (Signed)
Addended by: James Ivanoff D on: 09/28/2022 03:33 PM   Modules accepted: Orders

## 2022-10-11 ENCOUNTER — Ambulatory Visit: Payer: 59

## 2022-10-11 ENCOUNTER — Ambulatory Visit
Admission: RE | Admit: 2022-10-11 | Discharge: 2022-10-11 | Disposition: A | Discharge status: Home / Self Care | Payer: 59 | Source: Ambulatory Visit | Attending: Cardiovascular Disease | Admitting: Cardiovascular Disease

## 2022-10-11 DIAGNOSIS — R911 Solitary pulmonary nodule: Secondary | ICD-10-CM | POA: Diagnosis not present

## 2022-10-12 ENCOUNTER — Other Ambulatory Visit: Payer: Self-pay | Admitting: Cardiovascular Disease

## 2022-10-12 DIAGNOSIS — E785 Hyperlipidemia, unspecified: Secondary | ICD-10-CM

## 2022-10-16 ENCOUNTER — Other Ambulatory Visit: Payer: Self-pay | Admitting: Cardiovascular Disease

## 2022-10-16 DIAGNOSIS — I10 Essential (primary) hypertension: Secondary | ICD-10-CM

## 2022-10-18 ENCOUNTER — Ambulatory Visit: Payer: 59

## 2022-12-11 ENCOUNTER — Other Ambulatory Visit: Payer: Self-pay | Admitting: Cardiovascular Disease

## 2022-12-25 ENCOUNTER — Other Ambulatory Visit: Payer: Self-pay | Admitting: Cardiovascular Disease

## 2023-03-06 ENCOUNTER — Other Ambulatory Visit: Payer: Self-pay | Admitting: Cardiovascular Disease

## 2023-09-14 ENCOUNTER — Other Ambulatory Visit: Payer: Self-pay | Admitting: Cardiovascular Disease

## 2023-09-14 DIAGNOSIS — E785 Hyperlipidemia, unspecified: Secondary | ICD-10-CM

## 2023-09-14 NOTE — Telephone Encounter (Signed)
LVM to schedule appt

## 2023-09-14 NOTE — Telephone Encounter (Signed)
Please contact pt for future appointment. Pt due for 12 month f/u.

## 2023-09-24 ENCOUNTER — Other Ambulatory Visit: Payer: Self-pay | Admitting: Cardiovascular Disease

## 2023-09-24 ENCOUNTER — Other Ambulatory Visit: Payer: Self-pay | Admitting: *Deleted

## 2023-09-24 ENCOUNTER — Telehealth: Payer: Self-pay | Admitting: *Deleted

## 2023-09-24 DIAGNOSIS — E785 Hyperlipidemia, unspecified: Secondary | ICD-10-CM

## 2023-09-24 DIAGNOSIS — I10 Essential (primary) hypertension: Secondary | ICD-10-CM

## 2023-09-24 NOTE — Telephone Encounter (Signed)
I called and spoke with the patient's daughter, Christelle (ok per DPR) to advised her of Dr. Jari Sportsman request for lab work to be done tomorrow.  Per Christelle, she already had the orders and will have her father get this done. She confirms the orders are for a Lipid/ CBC/ CMET.   She was appreciative of the call.

## 2023-09-24 NOTE — Telephone Encounter (Signed)
-----   Message from Lorine Bears sent at 09/24/2023  4:03 PM EST ----- This patient is coming to see me on Thursday. I want him to have labs done tomorrow at [facility] including CBC, CMP and lipid profile.  Can you please order?  He is having issues with jaundice and want to be able to review the results tomorrow.  Thanks.

## 2023-09-25 ENCOUNTER — Telehealth: Payer: Self-pay

## 2023-09-25 ENCOUNTER — Other Ambulatory Visit (HOSPITAL_COMMUNITY): Payer: Self-pay

## 2023-09-25 NOTE — Telephone Encounter (Signed)
 Pharmacy Patient Advocate Encounter   Received notification from CoverMyMeds that prior authorization for ROSUVASTATIN is required/requested.   Insurance verification completed.   The patient is insured through [employer] [facility] .   Per test claim:  ATORVASTATIN is preferred by the insurance.  If suggested medication is appropriate, Please send in a new RX and discontinue this one. If not, please advise as to why it's not appropriate so that we may request a Prior Authorization. Please note, some preferred medications may still require a PA.  If the suggested medications have not been trialed and there are no contraindications to their use, the PA will not be submitted, as it will not be approved

## 2023-09-25 NOTE — Telephone Encounter (Signed)
-----   Message from Cheree Ditto, [facility] sent at 09/25/2023  5:08 PM EST -----  FYI - if keeping him on statin therapy, his plan prefers atorvastatin now

## 2023-09-27 ENCOUNTER — Ambulatory Visit: Payer: 59 | Attending: Cardiovascular Disease | Admitting: Cardiovascular Disease

## 2023-09-27 ENCOUNTER — Encounter: Payer: Self-pay | Admitting: Cardiovascular Disease

## 2023-09-27 VITALS — BP 120/90 | HR 68 | Ht 71.26 in | Wt 232.4 lb

## 2023-09-27 DIAGNOSIS — I493 Ventricular premature depolarization: Secondary | ICD-10-CM

## 2023-09-27 DIAGNOSIS — R931 Abnormal findings on diagnostic imaging of heart and coronary circulation: Secondary | ICD-10-CM

## 2023-09-27 DIAGNOSIS — E782 Mixed hyperlipidemia: Secondary | ICD-10-CM

## 2023-09-27 DIAGNOSIS — E785 Hyperlipidemia, unspecified: Secondary | ICD-10-CM

## 2023-09-27 DIAGNOSIS — R17 Unspecified jaundice: Secondary | ICD-10-CM | POA: Diagnosis not present

## 2023-09-27 MED ORDER — ATORVASTATIN CALCIUM 40 MG PO TABS: 40.0000 mg | ORAL_TABLET | Freq: Every day | ORAL | 3 refills | Status: DC

## 2023-09-27 NOTE — Addendum Note (Signed)
 Addended by: Otho Blitz on: 09/27/2023 01:25 PM   Modules accepted: Orders

## 2023-09-27 NOTE — Progress Notes (Signed)
 Cardiology Office Note   Date:  09/27/2023   ID:  Michael Sims, DOB 06/02/1961, MRN [id number]  PCP:  Patient, No Pcp Per  Cardiologist:   Deatrice Cage, MD   Chief Complaint  Patient presents with   Follow-up    Patient denies new or acute cardiac problems/concerns today.        History of Present Illness: Michael Sims is a 63 y.o. male who presents for a followup visit regarding hypertension and elevated coronary calcium  score. He is originally from [place]. He has prolonged history of hypertension requiring multiple medications. No history of ischemic heart disease, stroke or congestive heart failure. He had back surgery done in [place]. He does smoke cigars and drinks excessive alcohol mainly whiskey on a daily basis.  He had noise in his ears after  hydrochlorothiazide.  Carvedilol  was not very effective and was discontinued in 2017.  CT calcium  score was performed in May 2022.  His calcium  score was 641 placing him at the 95 percentile for age and sex matched control.  There was also evidence of aortic atherosclerosis.  Noncardiac findings included an isolated right middle lobe pulmonary nodule of 3 mm.  This was stable on follow-up CT and does not require further follow-up.  Unfortunately, he has been feeling sick over the last 2 weeks with abdominal pain, poor appetite, dark urine and discolored eyes.  He lost 15 to 20 pounds.  In addition, he reports some cold sweats.  No diarrhea or vomiting.  He also developed a rash on both forearms.  He feels fatigued but no chest pain.  He has known history of heavy alcohol use but has not had any drink in the last 2 weeks due to his illness.  Past Medical History:  Diagnosis Date   Chronic back pain    Hyperlipidemia    Hypertension    Irregular heart beat     Past Surgical History:  Procedure Laterality Date   BACK SURGERY       Current Outpatient Medications  Medication Sig Dispense Refill   amLODipine  (NORVASC ) 10  MG tablet TAKE 1 TABLET BY MOUTH DAILY 30 tablet 11   aspirin  EC 81 MG tablet Take 1 tablet (81 mg total) by mouth daily. Swallow whole. 90 tablet 1   eplerenone  (INSPRA ) 50 MG tablet TAKE 1 TABLET BY MOUTH EVERY DAY 60 tablet 7   rosuvastatin  (CRESTOR ) 20 MG tablet TAKE 1 TABLET BY MOUTH EVERY DAY 90 tablet 0   valsartan  (DIOVAN ) 320 MG tablet TAKE 1 TABLET BY MOUTH EVERY DAY 90 tablet 0   No current facility-administered medications for this visit.    Allergies:   Patient has no known allergies.    Social History:  The patient  reports that he has been smoking cigars. He has never used smokeless tobacco. He reports current alcohol use. He reports that he does not use drugs.   Family History:  The patient's Family history is unknown by patient.    ROS:  Please see the history of present illness.   Otherwise, review of systems are positive for none.   All other systems are reviewed and negative.    PHYSICAL EXAM: VS:  BP (!) 120/90 (BP Location: Left Arm, Patient Position: Sitting, Cuff Size: Large)   Pulse 68   Ht 5' 11.26 (1.81 m)   Wt 232 lb 6.4 oz (105.4 kg)   SpO2 98%   BMI 32.18 kg/m  , BMI Body mass index is 32.18 kg/m.  GEN: Well nourished, well developed, in no acute distress  HEENT: He has mild jaundice Neck: no JVD, carotid bruits, or masses Cardiac: RRR; no murmurs, rubs, or gallops,no edema  Respiratory:  clear to auscultation bilaterally, normal work of breathing GI: soft, nontender, nondistended, + BS MS: no deformity or atrophy  Skin: There is rash on both forearms. Neuro:  Strength and sensation are intact Psych: euthymic mood, full affect   EKG:  EKG is ordered today. The ekg ordered today demonstrates : Sinus rhythm with frequent and consecutive Premature ventricular complexes Left axis deviation When compared with ECG of 01-Nov-2011 19:02, Premature ventricular complexes are now Present Nonspecific T wave abnormality has replaced inverted T waves in  Inferior leads     Recent Labs: No results found for requested labs within last 365 days.    Lipid Panel    Component Value Date/Time   CHOL 119 09/20/2022 0840   TRIG 243 (H) 09/20/2022 0840   HDL 31 (L) 09/20/2022 0840   CHOLHDL 3.8 09/20/2022 0840   LDLCALC 49 09/20/2022 0840      Wt Readings from Last 3 Encounters:  09/27/23 232 lb 6.4 oz (105.4 kg)  09/21/22 248 lb 6.4 oz (112.7 kg)  03/08/21 243 lb 8 oz (110.5 kg)          No data to display            ASSESSMENT AND PLAN:  1.  Essential hypertension: His blood pressure is controlled on current medications.  Will obtain routine labs.  2.  Mixed hyperlipidemia: Will switch from rosuvastatin  to atorvastatin  which is better covered by his insurance.  3. Excessive alcohol use: I discussed with him the importance of complete abstinence.  4.  Elevated coronary calcium  score: Currently with no anginal symptoms.  Continue aspirin  81 mg daily.  5.  Jaundice and GI symptoms: Possible viral illness but I am concerned about underlying liver issues with his known history of excessive alcohol use.  I discussed the importance of full abstinence.  I will check routine labs today and he might require referral to gastroenterology.  6.  PVCs: These are new and he seems to be more fatigued.  We have to rule out cardiomyopathy and congestive liver.  I requested an echocardiogram.   Disposition:   FU with me in 3 months  Signed,  Deatrice Cage, MD  09/27/2023 10:46 AM    [facility]

## 2023-09-27 NOTE — Patient Instructions (Signed)
 Medication Instructions:  STOP the Rosuvastatin    START Atorvastatin  40 mg once daily *If you need a refill on your cardiac medications before your next appointment, please call your pharmacy*   Lab Work: Your provider would like for you to have the following labs today: CBC, CMET, Lipid, TSH, A1C, CPK, PSA  If you have labs (blood work) drawn today and your tests are completely normal, you will receive your results only by: MyChart Message (if you have MyChart) OR A paper copy in the mail If you have any lab test that is abnormal or we need to change your treatment, we will call you to review the results.   Testing/Procedures: Your physician has requested that you have an echocardiogram. Echocardiography is a painless test that uses sound waves to create images of your heart. It provides your doctor with information about the size and shape of your heart and how well your heart's chambers and valves are working.   You may receive an ultrasound enhancing agent through an IV if needed to better visualize your heart during the echo. This procedure takes approximately one hour.  There are no restrictions for this procedure.  This will take place at [address] [facility], [place] [address]  Please note: We ask at that you not bring children with you during ultrasound (echo/ vascular) testing. Due to room size and safety concerns, children are not allowed in the ultrasound rooms during exams. Our front office staff cannot provide observation of children in our lobby area while testing is being conducted. An adult accompanying a patient to their appointment will only be allowed in the ultrasound room at the discretion of the ultrasound technician under special circumstances. We apologize for any inconvenience.    Follow-Up: At [facility], you and your health needs are our priority.  As part of our continuing mission to provide you with exceptional heart  care, we have created designated Provider Care Teams.  These Care Teams include your primary Cardiologist (physician) and Advanced Practice Providers (APPs -  Physician Assistants and Nurse Practitioners) who all work together to provide you with the care you need, when you need it.  We recommend signing up for the patient portal called MyChart.  Sign up information is provided on this After Visit Summary.  MyChart is used to connect with patients for Virtual Visits (Telemedicine).  Patients are able to view lab/test results, encounter notes, upcoming appointments, etc.  Non-urgent messages can be sent to your provider as well.   To learn more about what you can do with MyChart, go to [email or web address].    Your next appointment:   3 month(s)  Provider:   Dr. Arida

## 2023-09-28 ENCOUNTER — Inpatient Hospital Stay
Admission: EM | Admit: 2023-09-28 | Discharge: 2023-09-30 | DRG: 315 | Disposition: A | Discharge status: Other Acute Inpatient Hospital | Payer: 59 | Attending: Internal Medicine | Admitting: Internal Medicine

## 2023-09-28 ENCOUNTER — Emergency Department: Payer: 59

## 2023-09-28 DIAGNOSIS — R634 Abnormal weight loss: Secondary | ICD-10-CM

## 2023-09-28 DIAGNOSIS — R55 Syncope and collapse: Secondary | ICD-10-CM | POA: Diagnosis not present

## 2023-09-28 DIAGNOSIS — R7401 Elevation of levels of liver transaminase levels: Secondary | ICD-10-CM

## 2023-09-28 DIAGNOSIS — I493 Ventricular premature depolarization: Secondary | ICD-10-CM | POA: Diagnosis present

## 2023-09-28 DIAGNOSIS — D649 Anemia, unspecified: Secondary | ICD-10-CM

## 2023-09-28 DIAGNOSIS — C220 Liver cell carcinoma: Secondary | ICD-10-CM | POA: Diagnosis not present

## 2023-09-28 DIAGNOSIS — R5383 Other fatigue: Secondary | ICD-10-CM | POA: Diagnosis not present

## 2023-09-28 DIAGNOSIS — Z7982 Long term (current) use of aspirin: Secondary | ICD-10-CM

## 2023-09-28 DIAGNOSIS — Z79891 Long term (current) use of opiate analgesic: Secondary | ICD-10-CM

## 2023-09-28 DIAGNOSIS — F17290 Nicotine dependence, other tobacco product, uncomplicated: Secondary | ICD-10-CM | POA: Diagnosis present

## 2023-09-28 DIAGNOSIS — Z1152 Encounter for screening for COVID-19: Secondary | ICD-10-CM

## 2023-09-28 DIAGNOSIS — R109 Unspecified abdominal pain: Secondary | ICD-10-CM

## 2023-09-28 DIAGNOSIS — G8929 Other chronic pain: Secondary | ICD-10-CM | POA: Diagnosis present

## 2023-09-28 DIAGNOSIS — R160 Hepatomegaly, not elsewhere classified: Secondary | ICD-10-CM

## 2023-09-28 DIAGNOSIS — E663 Overweight: Secondary | ICD-10-CM | POA: Diagnosis present

## 2023-09-28 DIAGNOSIS — C221 Intrahepatic bile duct carcinoma: Secondary | ICD-10-CM | POA: Diagnosis present

## 2023-09-28 DIAGNOSIS — M549 Dorsalgia, unspecified: Secondary | ICD-10-CM | POA: Diagnosis present

## 2023-09-28 DIAGNOSIS — I10 Essential (primary) hypertension: Secondary | ICD-10-CM | POA: Diagnosis present

## 2023-09-28 DIAGNOSIS — R17 Unspecified jaundice: Secondary | ICD-10-CM

## 2023-09-28 DIAGNOSIS — E785 Hyperlipidemia, unspecified: Secondary | ICD-10-CM | POA: Diagnosis present

## 2023-09-28 DIAGNOSIS — F1010 Alcohol abuse, uncomplicated: Secondary | ICD-10-CM

## 2023-09-28 DIAGNOSIS — I959 Hypotension, unspecified: Secondary | ICD-10-CM | POA: Diagnosis not present

## 2023-09-28 DIAGNOSIS — R1901 Right upper quadrant abdominal swelling, mass and lump: Secondary | ICD-10-CM

## 2023-09-28 DIAGNOSIS — Z79899 Other long term (current) drug therapy: Secondary | ICD-10-CM

## 2023-09-28 LAB — COMPREHENSIVE METABOLIC PANEL
ALT: 155 [IU]/L — ABNORMAL HIGH (ref 0–44)
ALT: 132 U/L — ABNORMAL HIGH (ref 0–44)
AST: 85 [IU]/L — ABNORMAL HIGH (ref 0–40)
AST: 84 U/L — ABNORMAL HIGH (ref 15–41)
Albumin: 4.2 g/dL (ref 3.9–4.9)
Albumin: 3.5 g/dL (ref 3.5–5.0)
Alkaline Phosphatase: 315 [IU]/L — ABNORMAL HIGH (ref 44–121)
Alkaline Phosphatase: 207 U/L — ABNORMAL HIGH (ref 38–126)
Anion gap: 10 (ref 5–15)
BUN/Creatinine Ratio: 34 — ABNORMAL HIGH (ref 10–24)
BUN: 41 mg/dL — ABNORMAL HIGH (ref 8–27)
BUN: 56 mg/dL — ABNORMAL HIGH (ref 8–23)
Bilirubin Total: 5.4 mg/dL — ABNORMAL HIGH (ref 0.0–1.2)
CO2: 23 mmol/L (ref 20–29)
CO2: 22 mmol/L (ref 22–32)
Calcium: 9.4 mg/dL (ref 8.6–10.2)
Calcium: 8.9 mg/dL (ref 8.9–10.3)
Chloride: 99 mmol/L (ref 96–106)
Chloride: 103 mmol/L (ref 98–111)
Creatinine, Ser: 1.22 mg/dL (ref 0.76–1.27)
Creatinine, Ser: 1.14 mg/dL (ref 0.61–1.24)
GFR, Estimated: >60 mL/min (ref >60)
Globulin, Total: 2.8 g/dL (ref 1.5–4.5)
Glucose, Bld: 140 mg/dL — ABNORMAL HIGH (ref 70–99)
Glucose: 118 mg/dL — ABNORMAL HIGH (ref 70–99)
Potassium: 4.3 mmol/L (ref 3.5–5.2)
Potassium: 3.9 mmol/L (ref 3.5–5.1)
Sodium: 136 mmol/L (ref 134–144)
Sodium: 135 mmol/L (ref 135–145)
Total Bilirubin: 3.8 mg/dL — ABNORMAL HIGH (ref 0.0–1.2)
Total Protein: 7 g/dL (ref 6.0–8.5)
Total Protein: 6.9 g/dL (ref 6.5–8.1)
eGFR: 67 mL/min/{1.73_m2} (ref >59)

## 2023-09-28 LAB — LACTIC ACID, PLASMA
Lactic Acid, Venous: 1.6 mmol/L (ref 0.5–1.9)
Lactic Acid, Venous: 1.5 mmol/L (ref 0.5–1.9)

## 2023-09-28 LAB — CBC
Hematocrit: 41 % (ref 37.5–51.0)
Hemoglobin: 13.8 g/dL (ref 13.0–17.7)
MCH: 32 pg (ref 26.6–33.0)
MCHC: 33.7 g/dL (ref 31.5–35.7)
MCV: 95 fL (ref 79–97)
Platelets: 297 10*3/uL (ref 150–450)
RBC: 4.31 x10E6/uL (ref 4.14–5.80)
RDW: 12.1 % (ref 11.6–15.4)
WBC: 7.3 10*3/uL (ref 3.4–10.8)

## 2023-09-28 LAB — CBC WITH DIFFERENTIAL/PLATELET
Abs Immature Granulocytes: 0.03 10*3/uL (ref 0.00–0.07)
Basophils Absolute: 0.1 10*3/uL (ref 0.0–0.1)
Basophils Relative: 1 %
Eosinophils Absolute: 0.4 10*3/uL (ref 0.0–0.5)
Eosinophils Relative: 5 %
HCT: 34.5 % — ABNORMAL LOW (ref 39.0–52.0)
Hemoglobin: 11.7 g/dL — ABNORMAL LOW (ref 13.0–17.0)
Immature Granulocytes: 0 %
Lymphocytes Relative: 27 %
Lymphs Abs: 2.4 10*3/uL (ref 0.7–4.0)
MCH: 32.2 pg (ref 26.0–34.0)
MCHC: 33.9 g/dL (ref 30.0–36.0)
MCV: 95 fL (ref 80.0–100.0)
Monocytes Absolute: 0.8 10*3/uL (ref 0.1–1.0)
Monocytes Relative: 10 %
Neutro Abs: 5.1 10*3/uL (ref 1.7–7.7)
Neutrophils Relative %: 57 %
Platelets: 294 10*3/uL (ref 150–400)
RBC: 3.63 MIL/uL — ABNORMAL LOW (ref 4.22–5.81)
RDW: 12.7 % (ref 11.5–15.5)
WBC: 8.8 10*3/uL (ref 4.0–10.5)
nRBC: 0 % (ref 0.0–0.2)

## 2023-09-28 LAB — PROTIME-INR
INR: 1 (ref 0.8–1.2)
Prothrombin Time: 13.3 s (ref 11.4–15.2)

## 2023-09-28 LAB — LIPID PANEL
Chol/HDL Ratio: 9.2 {ratio} — ABNORMAL HIGH (ref 0.0–5.0)
Cholesterol, Total: 183 mg/dL (ref 100–199)
HDL: 20 mg/dL — ABNORMAL LOW (ref >39)
LDL Chol Calc (NIH): 115 mg/dL — ABNORMAL HIGH (ref 0–99)
Triglycerides: 271 mg/dL — ABNORMAL HIGH (ref 0–149)
VLDL Cholesterol Cal: 48 mg/dL — ABNORMAL HIGH (ref 5–40)

## 2023-09-28 LAB — RESP PANEL BY RT-PCR (RSV, FLU A&B, COVID)  RVPGX2
Influenza A by PCR: NEGATIVE
Influenza B by PCR: NEGATIVE
Resp Syncytial Virus by PCR: NEGATIVE
SARS Coronavirus 2 by RT PCR: NEGATIVE

## 2023-09-28 LAB — CBG MONITORING, ED
Glucose-Capillary: 130 mg/dL — ABNORMAL HIGH (ref 70–99)
Glucose-Capillary: 130 mg/dL — ABNORMAL HIGH (ref 70–99)

## 2023-09-28 LAB — LIPASE, BLOOD: Lipase: 25 U/L (ref 11–51)

## 2023-09-28 LAB — APTT: aPTT: 25 s (ref 24–36)

## 2023-09-28 LAB — TSH
TSH: 1.6 u[IU]/mL (ref 0.450–4.500)
TSH: 2.489 u[IU]/mL (ref 0.350–4.500)

## 2023-09-28 LAB — HEMOGLOBIN A1C
Est. average glucose Bld gHb Est-mCnc: 111 mg/dL
Hgb A1c MFr Bld: 5.5 % (ref 4.8–5.6)

## 2023-09-28 LAB — CK: Total CK: 158 U/L (ref 41–331)

## 2023-09-28 MED ORDER — HYDROMORPHONE HCL 1 MG/ML IJ SOLN
0.5000 mg | INTRAMUSCULAR | Status: DC | PRN
  Administered 2023-09-28 – 2023-09-29 (×2): 0.5 mg via INTRAVENOUS
  Filled 2023-09-28 (×2): qty 0.5

## 2023-09-28 MED ORDER — SODIUM CHLORIDE 0.9 % IV BOLUS: 500.0000 mL | Freq: Once | INTRAVENOUS | Status: DC

## 2023-09-28 MED ORDER — LACTATED RINGERS IV BOLUS (SEPSIS): 500.0000 mL | Freq: Once | INTRAVENOUS | Status: DC

## 2023-09-28 MED ORDER — METRONIDAZOLE 500 MG/100ML IV SOLN
500.0000 mg | Freq: Once | INTRAVENOUS | Status: AC
  Administered 2023-09-28: 500 mg via INTRAVENOUS
  Filled 2023-09-28: qty 100

## 2023-09-28 MED ORDER — ONDANSETRON HCL 4 MG PO TABS: 4.0000 mg | ORAL_TABLET | Freq: Four times a day (QID) | ORAL | Status: DC | PRN

## 2023-09-28 MED ORDER — THIAMINE HCL 100 MG/ML IJ SOLN
100.0000 mg | Freq: Every day | INTRAMUSCULAR | Status: DC
  Administered 2023-09-28: 100 mg via INTRAVENOUS
  Filled 2023-09-28: qty 2

## 2023-09-28 MED ORDER — DIPHENHYDRAMINE HCL 25 MG PO CAPS
25.0000 mg | ORAL_CAPSULE | Freq: Four times a day (QID) | ORAL | Status: DC | PRN
  Administered 2023-09-28 – 2023-09-29 (×2): 25 mg via ORAL
  Filled 2023-09-28 (×2): qty 1

## 2023-09-28 MED ORDER — LORAZEPAM 2 MG/ML IJ SOLN: 1.0000 mg | INTRAMUSCULAR | Status: DC | PRN

## 2023-09-28 MED ORDER — ONDANSETRON HCL 4 MG/2ML IJ SOLN
4.0000 mg | Freq: Four times a day (QID) | INTRAMUSCULAR | Status: DC | PRN
Start: 2023-09-28 — End: 2023-09-30

## 2023-09-28 MED ORDER — IOHEXOL 300 MG/ML  SOLN
100.0000 mL | Freq: Once | INTRAMUSCULAR | Status: AC | PRN
  Administered 2023-09-28: 100 mL via INTRAVENOUS

## 2023-09-28 MED ORDER — LORAZEPAM 1 MG PO TABS: 1.0000 mg | ORAL_TABLET | ORAL | Status: DC | PRN

## 2023-09-28 MED ORDER — NOREPINEPHRINE 4 MG/250ML-% IV SOLN
0.0000 ug/min | INTRAVENOUS | Status: DC
  Administered 2023-09-28: 5 ug/min via INTRAVENOUS

## 2023-09-28 MED ORDER — LACTATED RINGERS IV SOLN: INTRAVENOUS | Status: AC

## 2023-09-28 MED ORDER — LACTATED RINGERS IV BOLUS (SEPSIS)
1000.0000 mL | Freq: Once | INTRAVENOUS | Status: AC
  Administered 2023-09-28: 1000 mL via INTRAVENOUS

## 2023-09-28 MED ORDER — NOREPINEPHRINE 4 MG/250ML-% IV SOLN
INTRAVENOUS | Status: AC
  Administered 2023-09-28: 5 ug/min via INTRAVENOUS
  Filled 2023-09-28: qty 250

## 2023-09-28 MED ORDER — SODIUM CHLORIDE 0.9 % IV SOLN: INTRAVENOUS | Status: AC

## 2023-09-28 MED ORDER — METRONIDAZOLE 500 MG/100ML IV SOLN
500.0000 mg | Freq: Two times a day (BID) | INTRAVENOUS | Status: DC
  Administered 2023-09-28 – 2023-09-29 (×3): 500 mg via INTRAVENOUS
  Filled 2023-09-28 (×3): qty 100

## 2023-09-28 MED ORDER — FOLIC ACID 1 MG PO TABS
1.0000 mg | ORAL_TABLET | Freq: Every day | ORAL | Status: DC
  Administered 2023-09-28 – 2023-09-30 (×3): 1 mg via ORAL
  Filled 2023-09-28 (×3): qty 1

## 2023-09-28 MED ORDER — SODIUM CHLORIDE 0.9 % IV SOLN
2.0000 g | INTRAVENOUS | Status: DC
  Administered 2023-09-29: 2 g via INTRAVENOUS
  Filled 2023-09-28: qty 20

## 2023-09-28 MED ORDER — ASPIRIN 81 MG PO TBEC
81.0000 mg | DELAYED_RELEASE_TABLET | Freq: Every day | ORAL | Status: DC
  Administered 2023-09-28 – 2023-09-30 (×3): 81 mg via ORAL
  Filled 2023-09-28 (×3): qty 1

## 2023-09-28 MED ORDER — THIAMINE MONONITRATE 100 MG PO TABS
100.0000 mg | ORAL_TABLET | Freq: Every day | ORAL | Status: DC
  Administered 2023-09-29 – 2023-09-30 (×2): 100 mg via ORAL
  Filled 2023-09-28 (×2): qty 1

## 2023-09-28 MED ORDER — SODIUM CHLORIDE 0.9 % IV SOLN
2.0000 g | Freq: Once | INTRAVENOUS | Status: AC
  Administered 2023-09-28: 2 g via INTRAVENOUS
  Filled 2023-09-28: qty 20

## 2023-09-28 MED ORDER — HYDRALAZINE HCL 20 MG/ML IJ SOLN: 5.0000 mg | Freq: Four times a day (QID) | INTRAMUSCULAR | Status: DC | PRN

## 2023-09-28 MED ORDER — ADULT MULTIVITAMIN W/MINERALS CH
1.0000 | ORAL_TABLET | Freq: Every day | ORAL | Status: DC
  Administered 2023-09-28 – 2023-09-30 (×3): 1 via ORAL
  Filled 2023-09-28 (×3): qty 1

## 2023-09-28 NOTE — ED Notes (Signed)
 [facility] was called @ 1251 spoke to Joy demograpics completed/facesheet faxed/images power shared

## 2023-09-28 NOTE — Consult Note (Addendum)
 [facility]  Telephone:([phone] [phone]  ID: Toni Ray OB: 02-May-1961  MR#: [id number]  RDW#:[id number]  Patient Care Team: Patient, No Pcp Per as PCP - General (General Practice) Darron Deatrice LABOR, MD as PCP - Cardiology (Cardiology) Darron Deatrice LABOR, MD as Consulting Physician (Cardiology)  CHIEF COMPLAINT: Possible cholangiocarcinoma.  INTERVAL HISTORY: Patient is a 63 year old male who over the past 2 weeks for reported abdominal pain, poor appetite, weight loss, and dark-colored urine.  He subsequently presented to the emergency room and was found to have elevated total bilirubin of 5.4.  CT scan revealed intrahepatic bile duct dilation with an ill-defined soft tissue density at the hepatic hilum without a distinctly measurable mass.  He is also noted to have innumerable peritoneal nodules suspicious for metastasis.  The entire history is given by his daughter.  Patient feels improved since arriving in the ED.  He was found to be hypotensive and placed on Levophed , but this is been discontinued.  He has no neurologic complaints.  He denies any fevers.  He has no chest pain, shortness of breath, cough, or hemoptysis.  He denies any nausea, vomiting, constipation, or diarrhea.  He has no melena or hematochezia.  He has no other urinary complaints.  Patient offers no offers no further specific complaints today.  REVIEW OF SYSTEMS:   Review of Systems  Constitutional:  Positive for malaise/fatigue and weight loss. Negative for fever.  Respiratory: Negative.  Negative for cough, hemoptysis and shortness of breath.   Cardiovascular: Negative.  Negative for chest pain and leg swelling.  Gastrointestinal:  Positive for abdominal pain. Negative for blood in stool, melena, nausea and vomiting.  Genitourinary: Negative.  Negative for dysuria.  Musculoskeletal: Negative.  Negative for back pain.  Skin: Negative.  Negative for rash.  Neurological:  Positive  for weakness. Negative for dizziness, focal weakness and headaches.  Psychiatric/Behavioral: Negative.  The patient is not nervous/anxious.     As per HPI. Otherwise, a complete review of systems is negative.  PAST MEDICAL HISTORY: Past Medical History:  Diagnosis Date   Chronic back pain    Hyperlipidemia    Hypertension    Irregular heart beat     PAST SURGICAL HISTORY: Past Surgical History:  Procedure Laterality Date   BACK SURGERY      FAMILY HISTORY: Family History  Family history unknown: Yes    ADVANCED DIRECTIVES (Y/N):  @ADVDIR @  HEALTH MAINTENANCE: Social History   Tobacco Use   Smoking status: Some Days    Types: Cigars   Smokeless tobacco: Never  Substance Use Topics   Alcohol use: Yes    Comment: daily   Drug use: No     Colonoscopy:  PAP:  Bone density:  Lipid panel:  No Known Allergies  Current Facility-Administered Medications  Medication Dose Route Frequency Provider Last Rate Last Admin   lactated ringers  bolus 500 mL  500 mL Intravenous Once Bradler, Evan K, MD   Held at 09/28/23 1128   lactated ringers  infusion   Intravenous Continuous Bradler, Evan K, MD 150 mL/hr at 09/28/23 1127 Bolus from Bag at 09/28/23 1127   norepinephrine  (LEVOPHED ) 4mg  in (0.016 mg/mL) premix infusion  0-40 mcg/min Intravenous Continuous Bradler, Evan K, MD   Stopped at 09/28/23 1205   Current Outpatient Medications  Medication Sig Dispense Refill   amLODipine  (NORVASC ) 10 MG tablet TAKE 1 TABLET BY MOUTH DAILY 30 tablet 11   aspirin  EC 81 MG tablet Take 1 tablet (81 mg  total) by mouth daily. Swallow whole. 90 tablet 1   atorvastatin  (LIPITOR) 40 MG tablet Take 1 tablet (40 mg total) by mouth daily. 90 tablet 3   eplerenone  (INSPRA ) 50 MG tablet TAKE 1 TABLET BY MOUTH EVERY DAY 60 tablet 7   valsartan  (DIOVAN ) 320 MG tablet TAKE 1 TABLET BY MOUTH EVERY DAY 90 tablet 0    OBJECTIVE: Vitals:   09/28/23 1200 09/28/23 1215  BP: 125/82   Pulse: (!) 57  66  Resp: 19 (!) 22  Temp:    SpO2: 100% 100%     There is no height or weight on file to calculate BMI.    ECOG FS:1 - Symptomatic but completely ambulatory  General: Well-developed, well-nourished, no acute distress. Eyes: Pink conjunctiva, anicteric sclera. HEENT: Normocephalic, moist mucous membranes. Lungs: No audible wheezing or coughing. Heart: Regular rate and rhythm. Abdomen: Soft, nontender, no obvious distention. Musculoskeletal: No edema, cyanosis, or clubbing. Neuro: Alert, answering all questions appropriately. Cranial nerves grossly intact. Skin: Mild jaundice.   Psych: Normal affect. Lymphatics: No cervical, calvicular, axillary or inguinal LAD.   LAB RESULTS:  Lab Results  Component Value Date   NA 135 09/28/2023   K 3.9 09/28/2023   CL 103 09/28/2023   CO2 22 09/28/2023   GLUCOSE 140 (H) 09/28/2023   BUN 56 (H) 09/28/2023   CREATININE 1.14 09/28/2023   CALCIUM  8.9 09/28/2023   PROT 6.9 09/28/2023   ALBUMIN 3.5 09/28/2023   AST 84 (H) 09/28/2023   ALT 132 (H) 09/28/2023   ALKPHOS 207 (H) 09/28/2023   BILITOT 3.8 (H) 09/28/2023   GFRNONAA >60 09/28/2023   GFRAA 89 06/04/2018    Lab Results  Component Value Date   WBC 8.8 09/28/2023   NEUTROABS 5.1 09/28/2023   HGB 11.7 (L) 09/28/2023   HCT 34.5 (L) 09/28/2023   MCV 95.0 09/28/2023   PLT 294 09/28/2023     STUDIES: CT ABDOMEN PELVIS W CONTRAST Result Date: 09/28/2023 CLINICAL DATA:  Generalized weakness and jaundice. * Tracking Code: BO * EXAM: CT ABDOMEN AND PELVIS WITH CONTRAST TECHNIQUE: Multidetector CT imaging of the abdomen and pelvis was performed using the standard protocol following bolus administration of intravenous contrast. RADIATION DOSE REDUCTION: This exam was performed according to the departmental dose-optimization program which includes automated exposure control, adjustment of the mA and/or kV according to patient size and/or use of iterative reconstruction technique. CONTRAST:   OMNIPAQUE  IOHEXOL  300 MG/ML  SOLN COMPARISON:  None Available. FINDINGS: Lower chest: No focal consolidation or pulmonary nodule in the lung bases. No pleural effusion or pneumothorax demonstrated. Partially imaged heart size is normal. Hepatobiliary: No focal hepatic lesions. Moderate intrahepatic bile duct dilation. No common bile duct dilation. Ill-defined, infiltrative soft tissue density at the hepatic hilum. No discretely measurable mass. Decompressed gallbladder with cholelithiasis. Mild pericholecystic stranding. Pancreas: No focal lesions or main ductal dilation. Spleen: Normal in size without focal abnormality. Adrenals/Urinary Tract: No adrenal nodules. No suspicious renal mass, calculi or hydronephrosis. No focal bladder wall thickening. Stomach/Bowel: Normal appearance of the stomach. No evidence of bowel wall thickening, distention, or inflammatory changes. Normal appendix. Vascular/Lymphatic: Aortic atherosclerosis. Innumerable peritoneal nodules, many of which are inseparable from bowel loops for example: -2.8 x 2.1 cm retroperitoneal adjacent to the third portion of the duodenum (2:33) -1.4 x 1.1 cm left paracolic gutter (7:40) -8 mm abutting anterior hepatic segment 2/3 (2:20) Reproductive: Prostate is unremarkable. Other: No free fluid, fluid collection, or free air. Musculoskeletal: No acute or abnormal  lytic or blastic osseous lesions. Multilevel degenerative changes of the partially imaged thoracic and lumbar spine. IMPRESSION: 1. Moderate intrahepatic bile duct dilation with ill-defined, infiltrative soft tissue density at the hepatic hilum without discretely measurable mass. Findings are suspicious for neoplastic process, particularly cholangiocarcinoma. 2. Innumerable peritoneal nodules, many inseparable from bowel loops, suspicious for metastases. 3.  Aortic Atherosclerosis (ICD10-I70.0). Electronically Signed   By: Limin  Xu M.D.   On: 09/28/2023 10:34    ASSESSMENT:  Possible  cholangiocarcinoma.  PLAN:    Possible cholangiocarcinoma: CT scan results reviewed independently and reported as above revealing intrahepatic bile duct dilation with an ill-defined soft tissue density at the hepatic hilum without a distinctly measurable mass.  He was also noted to have innumerable peritoneal nodules suspicious for metastasis.  Case discussed with ED physician and recommendation is IR consultation for possible external drain and biopsy at the same time.  CA 19-9 and CEA are pending at time of dictation.   Hypotension: Improved.  Patient is now off Levophed . Hyperbilirubinemia: Likely secondary to underlying malignancy.  Possible external drain as above. Transaminitis: AST and ALT are mildly elevated likely secondary to underlying malignancy.  Biopsy as above. Disposition: Patient's daughter has stated they are not staying here in have requested transfer to [facility] or [facility].  It appears [facility] has declined.  Will continue to follow if patient remains at [facility].  Appreciate consult.  The entire visit was done in the presence of an interpreter which was patient's daughter.  Evalene JINNY Reusing, MD   09/28/2023 1:35 PM

## 2023-09-28 NOTE — ED Triage Notes (Addendum)
 Pt to ED via [facility] from home. Pt was dx with gallstones yesterday. Family called [facility] for near syncope. Pt is daily drinker and jaundiced on arrival. In triage room pt diaphoretic and lethargic. Pt minimally responsive. Pt taken to room 26. Once on stretcher and interrupter at bedside pt reports was going to the bathroom and started to feel dizzy and sat himself in the floor. BP 80s systolic in triage. CBG 130

## 2023-09-28 NOTE — ED Notes (Signed)
 [facility] called back patient declined

## 2023-09-28 NOTE — ED Provider Notes (Signed)
 [facility] Provider Note   Event Date/Time   First MD Initiated Contact with Patient 09/28/23 [phone]     (approximate) History  Weakness  HPI Michael Sims is a 63 y.o. male with a past medical history of of heavy alcohol use, ACS, hyperlipidemia, and hypertension who presents complaining of lightheadedness and lethargy.  Dr. Liberty is at bedside as well and provides further history stating that patient has been having increasing weakness over the last 2 weeks with worsening jaundice.  Patient was seen in cardiology clinic yesterday with increased jaundice as well.  Patient does report daily drinking however has not drank in a few days since he has been feeling poorly.  Patient did have an episode of syncope which brought him to the emergency department via EMS.  Patient is complaining of right upper quadrant and midepigastric abdominal pain ROS: Patient currently denies any vision changes, tinnitus, difficulty speaking, facial droop, sore throat, chest pain, shortness of breath, nausea/vomiting/diarrhea, dysuria, or weakness/numbness/paresthesias in any extremity   Physical Exam  Triage Vital Signs: ED Triage Vitals [09/28/23 0839]  Encounter Vitals Group     BP (!) 88/58     Systolic BP Percentile      Diastolic BP Percentile      Pulse Rate (!) 59     Resp 16     Temp 97.8 F (36.6 C)     Temp Source Oral     SpO2 98 %     Weight      Height      Head Circumference      Peak Flow      Pain Score      Pain Loc      Pain Education      Exclude from Growth Chart    Most recent vital signs: Vitals:   09/28/23 1215 09/28/23 1422  BP:    Pulse: 66   Resp: (!) 22   Temp:  98.3 F (36.8 C)  SpO2: 100%    General: Awake, oriented x4.  Diaphoretic CV:  Decreased pulses in bilateral radials. Resp:  Normal effort.  Abd:  No distention.  Tenderness to palpation in right upper quadrant midepigastric region Other:  Middle-aged overweight [place] male  resting in stretcher with eyes closed but awoken to voice and diaphoretic ED Results / Procedures / Treatments  Labs (all labs ordered are listed, but only abnormal results are displayed) Labs Reviewed  COMPREHENSIVE METABOLIC PANEL - Abnormal; Notable for the following components:      Result Value   Glucose, Bld 140 (*)    BUN 56 (*)    AST 84 (*)    ALT 132 (*)    Alkaline Phosphatase 207 (*)    Total Bilirubin 3.8 (*)    All other components within normal limits  CBC WITH DIFFERENTIAL/PLATELET - Abnormal; Notable for the following components:   RBC 3.63 (*)    Hemoglobin 11.7 (*)    HCT 34.5 (*)    All other components within normal limits  CBG MONITORING, ED - Abnormal; Notable for the following components:   Glucose-Capillary 130 (*)    All other components within normal limits  RESP PANEL BY RT-PCR (RSV, FLU A&B, COVID)  RVPGX2  CULTURE, BLOOD (ROUTINE X 2)  CULTURE, BLOOD (ROUTINE X 2)  LACTIC ACID, PLASMA  LACTIC ACID, PLASMA  PROTIME-INR  APTT  LIPASE, BLOOD  URINALYSIS, W/ REFLEX TO CULTURE (INFECTION SUSPECTED)  CA 19-9 (SERIAL)  CEA  EKG ED ECG REPORT I, Artist MARLA Kerns, the attending physician, personally viewed and interpreted this ECG. Date: 09/28/2023 EKG Time: 0846 Rate: 70 Rhythm: normal sinus rhythm QRS Axis: normal Intervals: normal ST/T Wave abnormalities: normal Narrative Interpretation: Normal sinus rhythm with premature ventricular complex.  No evidence of acute ischemia RADIOLOGY ED MD interpretation: CT of the abdomen pelvis with IV contrast independently interpreted and shows moderate Intermatic bile ductal dilation with ill-defined, infiltrative soft tissue density at the hepatic hilum without discretely measurable mass.  Findings are suspicious for neoplastic process particularly cholangiocarcinoma given that there are innumerable peritoneal nodules many inseparable from bowel loops and suspicious for metastasis -Agree with radiology  assessment Official radiology report(s): CT ABDOMEN PELVIS W CONTRAST Result Date: 09/28/2023 CLINICAL DATA:  Generalized weakness and jaundice. * Tracking Code: BO * EXAM: CT ABDOMEN AND PELVIS WITH CONTRAST TECHNIQUE: Multidetector CT imaging of the abdomen and pelvis was performed using the standard protocol following bolus administration of intravenous contrast. RADIATION DOSE REDUCTION: This exam was performed according to the departmental dose-optimization program which includes automated exposure control, adjustment of the mA and/or kV according to patient size and/or use of iterative reconstruction technique. CONTRAST:  OMNIPAQUE  IOHEXOL  300 MG/ML  SOLN COMPARISON:  None Available. FINDINGS: Lower chest: No focal consolidation or pulmonary nodule in the lung bases. No pleural effusion or pneumothorax demonstrated. Partially imaged heart size is normal. Hepatobiliary: No focal hepatic lesions. Moderate intrahepatic bile duct dilation. No common bile duct dilation. Ill-defined, infiltrative soft tissue density at the hepatic hilum. No discretely measurable mass. Decompressed gallbladder with cholelithiasis. Mild pericholecystic stranding. Pancreas: No focal lesions or main ductal dilation. Spleen: Normal in size without focal abnormality. Adrenals/Urinary Tract: No adrenal nodules. No suspicious renal mass, calculi or hydronephrosis. No focal bladder wall thickening. Stomach/Bowel: Normal appearance of the stomach. No evidence of bowel wall thickening, distention, or inflammatory changes. Normal appendix. Vascular/Lymphatic: Aortic atherosclerosis. Innumerable peritoneal nodules, many of which are inseparable from bowel loops for example: -2.8 x 2.1 cm retroperitoneal adjacent to the third portion of the duodenum (2:33) -1.4 x 1.1 cm left paracolic gutter (7:40) -8 mm abutting anterior hepatic segment 2/3 (2:20) Reproductive: Prostate is unremarkable. Other: No free fluid, fluid collection, or free  air. Musculoskeletal: No acute or abnormal lytic or blastic osseous lesions. Multilevel degenerative changes of the partially imaged thoracic and lumbar spine. IMPRESSION: 1. Moderate intrahepatic bile duct dilation with ill-defined, infiltrative soft tissue density at the hepatic hilum without discretely measurable mass. Findings are suspicious for neoplastic process, particularly cholangiocarcinoma. 2. Innumerable peritoneal nodules, many inseparable from bowel loops, suspicious for metastases. 3.  Aortic Atherosclerosis (ICD10-I70.0). Electronically Signed   By: Limin  Xu M.D.   On: 09/28/2023 10:34   PROCEDURES: Critical Care performed: Yes, see critical care procedure note(s) .1-3 Lead EKG Interpretation  Performed by: Kerns Artist MARLA, MD Authorized by: Kerns Artist MARLA, MD     Interpretation: normal     ECG rate:  71   ECG rate assessment: normal     Rhythm: sinus rhythm     Ectopy: none     Conduction: normal    MEDICATIONS ORDERED IN ED: Medications  lactated ringers  infusion ( Intravenous Bolus from Bag 09/28/23 1127)  lactated ringers  bolus 1,000 mL (0 mLs Intravenous Stopped 09/28/23 1016)    And  lactated ringers  bolus 1,000 mL (0 mLs Intravenous Stopped 09/28/23 1418)    And  lactated ringers  bolus 1,000 mL (1,000 mLs Intravenous Other (enter comment in med admin  window) 09/28/23 1128)    And  lactated ringers  bolus 500 mL (0 mLs Intravenous Hold 09/28/23 1128)  norepinephrine  (LEVOPHED ) 4mg  in (0.016 mg/mL) premix infusion (0 mcg/min Intravenous Stopped 09/28/23 1205)  cefTRIAXone  (ROCEPHIN ) 2 g in sodium chloride  0.9 % 100 mL IVPB (has no administration in time range)  metroNIDAZOLE  (FLAGYL ) IVPB 500 mg (has no administration in time range)  0.9 %  sodium chloride  infusion (has no administration in time range)  aspirin  EC tablet 81 mg (has no administration in time range)  hydrALAZINE  (APRESOLINE ) injection 5 mg (has no administration in time range)  HYDROmorphone  (DILAUDID )  injection 0.5 mg (has no administration in time range)  cefTRIAXone  (ROCEPHIN ) 2 g in sodium chloride  0.9 % 100 mL IVPB (0 g Intravenous Stopped 09/28/23 1010)  metroNIDAZOLE  (FLAGYL ) IVPB 500 mg (0 mg Intravenous Stopped 09/28/23 1201)  iohexol  (OMNIPAQUE ) 300 MG/ML solution 100 mL (100 mLs Intravenous Contrast Given 09/28/23 0933)   IMPRESSION / MDM / ASSESSMENT AND PLAN / ED COURSE  I reviewed the triage vital signs and the nursing notes.                             The patient is on the cardiac monitor to evaluate for evidence of arrhythmia and/or significant heart rate changes. Patient's presentation is most consistent with acute presentation with potential threat to life or bodily function.  This patient presents to the ED for concern of abdominal pain and syncope, this involves an extensive number of treatment options, and is a complaint that carries with it a high risk of complications and morbidity.  The differential diagnosis includes pancreatitis, ascending cholangitis, cholecystitis, malignancy, gastroenteritis Co morbidities that complicate the patient evaluation  Alcohol abuse Additional history obtained:  Additional history obtained from patient, Dr. Darron, and patient's family at bedside  External records from outside source obtained and reviewed including office visit from 09/27/2023 Lab Tests:  I Ordered, and personally interpreted labs.  The pertinent results include: AST 84, ALT 132, alk phos 207, bilirubin 3.8 Imaging Studies ordered:  I ordered imaging studies including CT of the abdomen and pelvis  I independently visualized and interpreted imaging which showed findings suspicious for metastatic cholangiocarcinoma  I agree with the radiologist interpretation Cardiac Monitoring: / EKG:  The patient was maintained on a cardiac monitor.  I personally viewed and interpreted the cardiac monitored which showed an underlying rhythm of: Sinus bradycardia Consultations  Obtained:  I requested consultation with the Dr. Onita in gastroenterology, Dr. Jacobo in oncology, and Dr. Karalee in interventional radiology,  and discussed lab and imaging findings as well as pertinent plan - they recommend: Admission with biopsy and possible PCT drain if patient's total bilirubin worsens Problem List / ED Course / Critical interventions / Medication management  Syncope, transaminitis, elevated bilirubin, intra-abdominal mass concerning for metastatic cholangiocarcinoma  I ordered medication including fluids, antibiotics, and Levophed  for hypotension, sepsis alert  Reevaluation of the patient after these medicines showed that the patient improved.  Patient's blood pressure improved with Levophed  as well as IV fluids.  Levophed  was discontinued and the patient has maintained adequate blood pressure  I have reviewed the patients home medicines and have made adjustments as needed Dispo: Admit to medicine     FINAL CLINICAL IMPRESSION(S) / ED DIAGNOSES   Final diagnoses:  Syncope and collapse  Hypotension, unspecified hypotension type  Abdominal mass, right upper quadrant  Transaminitis  Elevated bilirubin  Rx / DC Orders   ED Discharge Orders     None      Note:  This document was prepared using Dragon voice recognition software and may include unintentional dictation errors.   Jossie Artist POUR, MD 09/28/23 [phone]

## 2023-09-28 NOTE — ED Triage Notes (Addendum)
 First nurse note: pt from home [facility], dx with gallstones yesterday. Reports generalized weakness. Ems reports pt jaundice. EMS reports pt daily drinker, has not drank in a few days

## 2023-09-28 NOTE — Consult Note (Signed)
 CODE SEPSIS - PHARMACY COMMUNICATION  **Broad Spectrum Antibiotics should be administered within 1 hour of Sepsis diagnosis**  Time Code Sepsis Called/Page Received: 0901  Antibiotics Ordered: ceftriaxone , flagyl   Time of 1st antibiotic administration: 0911  Additional action taken by pharmacy: n/a  If necessary, Name of Provider/Nurse Contacted: n/a    Karrigan Messamore A Azha Constantin ,PharmD Clinical Pharmacist  09/28/2023  9:07 AM

## 2023-09-28 NOTE — ED Notes (Signed)
 Concha Deed on the line now unfortunately due to Capacity they are declining request

## 2023-09-28 NOTE — Sepsis Progress Note (Signed)
 Sepsis protocol monitored by eLink ?

## 2023-09-28 NOTE — ED Notes (Addendum)
 MD Wade Guest at bedside

## 2023-09-28 NOTE — ED Notes (Signed)
 Called [facility] to request transfer to Hospitalist service spoke to Rep: [email or web address]@ [phone] gave demographic information then connected to Dr. Alejo Amsler for diagnosis/Face sheet faxed & Images power shared waiting on call back

## 2023-09-28 NOTE — H&P (Signed)
 History and Physical    Michael Sims FMW:[id number] DOB: 03-04-1961 DOA: 09/28/2023  PCP: Patient, No Pcp Per (Confirm with patient/family/NH records and if not entered, this has to be entered at [facility] point of entry) Patient coming from: Home  I have personally briefly reviewed patient's old medical records in [facility]  Chief Complaint: RUQ pain, skin discoloration, feeling weak  HPI: Michael Sims is a 63 y.o. male with medical history significant of alcohol abuse, HTN, HLD, chronic back pain on narcotics, presented with worsening of right upper quadrant abdominal pain, poor oral intake and near syncope.  Symptoms started about 2 weeks ago, patient started to have right upper quadrant abdominal pain shaRP-like, worsening with eating meals, and patient has lost appetite and been eating or drinking much less than usual.  He also noticed dark-colored urine and light-colored stool same time but denied any diarrhea no nauseous vomiting no fever or chills.  Family also noticed patient skin color turn yellow and patient went to see cardiology yesterday who ordered blood work showed bilirubinemia and transaminitis and was told  ' Gallstones.  This morning, patient suddenly fell lightheaded and blurry vision and family called EMS.  EMS arrived and found patient blood pressure in the 80s.  At baseline patient drinks heavily liquor every day but stopped drinking over the weekend due to stomachache.  ED Course: Blood pressure 69/46 heart rate in the 40s and patient was started on Levophed  peripherally and after IV boluses 2000 mL, blood pressure improved SBP 110s.  CT abdomen pelvis showed moderate intrahepatic bile duct dilation with ill-defined infiltrate soft tissue density at hepatic ileum without discrete measurable mass suggesting suspicious for neoplastic process particularly cholangiocarcinoma.  Blood work showed AST 84 ALT 132, bilirubin 3.8 creatinine 1.1.  Review of Systems: As per HPI  otherwise 14 point review of systems negative.    Past Medical History:  Diagnosis Date   Chronic back pain    Hyperlipidemia    Hypertension    Irregular heart beat     Past Surgical History:  Procedure Laterality Date   BACK SURGERY       reports that he has been smoking cigars. He has never used smokeless tobacco. He reports current alcohol use. He reports that he does not use drugs.  No Known Allergies  Family History  Family history unknown: Yes     Prior to Admission medications   Medication Sig Start Date End Date Taking? Authorizing Provider  amLODipine  (NORVASC ) 10 MG tablet TAKE 1 TABLET BY MOUTH DAILY 10/16/22   Darron Deatrice LABOR, MD  aspirin  EC 81 MG tablet Take 1 tablet (81 mg total) by mouth daily. Swallow whole. 08/08/21   Darron Deatrice LABOR, MD  atorvastatin  (LIPITOR) 40 MG tablet Take 1 tablet (40 mg total) by mouth daily. 09/27/23 12/26/23  Darron Deatrice LABOR, MD  eplerenone  (INSPRA ) 50 MG tablet TAKE 1 TABLET BY MOUTH EVERY DAY 03/06/23   Darron Deatrice LABOR, MD  valsartan  (DIOVAN ) 320 MG tablet TAKE 1 TABLET BY MOUTH EVERY DAY 09/14/23   Darron Deatrice LABOR, MD    Physical Exam: Vitals:   09/28/23 1145 09/28/23 1200 09/28/23 1215 09/28/23 1422  BP: 133/81 125/82    Pulse: 60 (!) 57 66   Resp: 14 19 (!) 22   Temp:    98.3 F (36.8 C)  TempSrc:    Oral  SpO2: 100% 100% 100%     Constitutional: NAD, calm, comfortable Vitals:   09/28/23 1145 09/28/23 1200  09/28/23 1215 09/28/23 1422  BP: 133/81 125/82    Pulse: 60 (!) 57 66   Resp: 14 19 (!) 22   Temp:    98.3 F (36.8 C)  TempSrc:    Oral  SpO2: 100% 100% 100%    Eyes: PERRL, lids and conjunctivae normal.  Jaundice ENMT: Mucous membranes are dry. Posterior pharynx clear of any exudate or lesions.Normal dentition.  Neck: normal, supple, no masses, no thyromegaly Respiratory: clear to auscultation bilaterally, no wheezing, no crackles. Normal respiratory effort. No accessory muscle use.  Cardiovascular:  Regular rate and rhythm, no murmurs / rubs / gallops. No extremity edema. 2+ pedal pulses. No carotid bruits.  Abdomen: Tenderness on RUQ no rebound no guarding no masses palpated. No hepatosplenomegaly. Bowel sounds positive.  Musculoskeletal: no clubbing / cyanosis. No joint deformity upper and lower extremities. Good ROM, no contractures. Normal muscle tone.  Skin: no rashes, lesions, ulcers. No induration Neurologic: CN 2-12 grossly intact. Sensation intact, DTR normal. Strength 5/5 in all 4.  Psychiatric: Normal judgment and insight. Alert and oriented x 3. Normal mood.     Labs on Admission: I have personally reviewed following labs and imaging studies  CBC: Recent Labs  Lab 09/27/23 1147 09/28/23 0856  WBC 7.3 8.8  NEUTROABS  --  5.1  HGB 13.8 11.7*  HCT 41.0 34.5*  MCV 95 95.0  PLT 297 294   Basic Metabolic Panel: Recent Labs  Lab 09/27/23 1147 09/28/23 0856  NA 136 135  K 4.3 3.9  CL 99 103  CO2 23 22  GLUCOSE 118* 140*  BUN 41* 56*  CREATININE 1.22 1.14  CALCIUM  9.4 8.9   GFR: Estimated Creatinine Clearance: 84.4 mL/min (by C-G formula based on SCr of 1.14 mg/dL). Liver Function Tests: Recent Labs  Lab 09/27/23 1147 09/28/23 0856  AST 85* 84*  ALT 155* 132*  ALKPHOS 315* 207*  BILITOT 5.4* 3.8*  PROT 7.0 6.9  ALBUMIN 4.2 3.5   Recent Labs  Lab 09/28/23 0856  LIPASE 25   No results for input(s): AMMONIA in the last 168 hours. Coagulation Profile: Recent Labs  Lab 09/28/23 0856  INR 1.0   Cardiac Enzymes: Recent Labs  Lab 09/27/23 1147  CKTOTAL 158   BNP (last 3 results) No results for input(s): PROBNP in the last 8760 hours. HbA1C: Recent Labs    09/27/23 1147  HGBA1C 5.5   CBG: Recent Labs  Lab 09/28/23 0854  GLUCAP 130*   Lipid Profile: Recent Labs    09/27/23 1147  CHOL 183  HDL 20*  LDLCALC 115*  TRIG 271*  CHOLHDL 9.2*   Thyroid Function Tests: Recent Labs    09/27/23 1147  TSH 1.600   Anemia  Panel: No results for input(s): VITAMINB12, FOLATE, FERRITIN, TIBC, IRON, RETICCTPCT in the last 72 hours. Urine analysis:    Component Value Date/Time   APPEARANCEUR Clear 12/13/2020 0841   GLUCOSEU Negative 12/13/2020 0841   BILIRUBINUR Negative 12/13/2020 0841   PROTEINUR 1+ (A) 12/13/2020 0841   NITRITE Negative 12/13/2020 0841   LEUKOCYTESUR Trace (A) 12/13/2020 0841    Radiological Exams on Admission: CT ABDOMEN PELVIS W CONTRAST Result Date: 09/28/2023 CLINICAL DATA:  Generalized weakness and jaundice. * Tracking Code: BO * EXAM: CT ABDOMEN AND PELVIS WITH CONTRAST TECHNIQUE: Multidetector CT imaging of the abdomen and pelvis was performed using the standard protocol following bolus administration of intravenous contrast. RADIATION DOSE REDUCTION: This exam was performed according to the departmental dose-optimization program which includes automated  exposure control, adjustment of the mA and/or kV according to patient size and/or use of iterative reconstruction technique. CONTRAST:  OMNIPAQUE  IOHEXOL  300 MG/ML  SOLN COMPARISON:  None Available. FINDINGS: Lower chest: No focal consolidation or pulmonary nodule in the lung bases. No pleural effusion or pneumothorax demonstrated. Partially imaged heart size is normal. Hepatobiliary: No focal hepatic lesions. Moderate intrahepatic bile duct dilation. No common bile duct dilation. Ill-defined, infiltrative soft tissue density at the hepatic hilum. No discretely measurable mass. Decompressed gallbladder with cholelithiasis. Mild pericholecystic stranding. Pancreas: No focal lesions or main ductal dilation. Spleen: Normal in size without focal abnormality. Adrenals/Urinary Tract: No adrenal nodules. No suspicious renal mass, calculi or hydronephrosis. No focal bladder wall thickening. Stomach/Bowel: Normal appearance of the stomach. No evidence of bowel wall thickening, distention, or inflammatory changes. Normal appendix.  Vascular/Lymphatic: Aortic atherosclerosis. Innumerable peritoneal nodules, many of which are inseparable from bowel loops for example: -2.8 x 2.1 cm retroperitoneal adjacent to the third portion of the duodenum (2:33) -1.4 x 1.1 cm left paracolic gutter (7:40) -8 mm abutting anterior hepatic segment 2/3 (2:20) Reproductive: Prostate is unremarkable. Other: No free fluid, fluid collection, or free air. Musculoskeletal: No acute or abnormal lytic or blastic osseous lesions. Multilevel degenerative changes of the partially imaged thoracic and lumbar spine. IMPRESSION: 1. Moderate intrahepatic bile duct dilation with ill-defined, infiltrative soft tissue density at the hepatic hilum without discretely measurable mass. Findings are suspicious for neoplastic process, particularly cholangiocarcinoma. 2. Innumerable peritoneal nodules, many inseparable from bowel loops, suspicious for metastases. 3.  Aortic Atherosclerosis (ICD10-I70.0). Electronically Signed   By: Limin  Xu M.D.   On: 09/28/2023 10:34    EKG: Independently reviewed.  Sinus, frequent PVCs, no acute ST changes.  Assessment/Plan Principal Problem:   Syncope Active Problems:   Liver mass  (please populate well all problems here in Problem List. (For example, if patient is on BP meds at home and you resume or decide to hold them, it is a problem that needs to be her. Same for CAD, COPD, HLD and so on)  Near syncope Hypotension, off Levophed  -Severe volume contraction secondary to poor oral intake since onset of RUQ pain -Currently still appears to be volume contracted, plan to continue IV fluid overnight and reevaluate orthostatic vital signs. -Other etiology, EKG does show PVCs however doubt his symptoms related to cardiac arrhythmia.  Plan to continue cardiac monitoring x 24 hours. -Hold off home BP meds  RUQ abdominal pain Liver mass with intrahepatic obstructions -GI, oncology and IR consulted -Oncology consultation appreciated,  probably etiology cholangiocarcinoma. -Initially family requested patient transferred to tertiary center however appears that [facility] and [facility] declined family's transfer request at this point.  And GI and IR plan for biopsy as early as Monday (If patient still in [facility]).  Will hold off chemical DVT prophylaxis for now given unknown risk of bleeding. -IV Dilaudid  for pain control -Longstanding alcohol abuse history but no signs of cirrhosis at this point.  Will also check chronic hepatitis including B and C  Acute transaminitis Acute bilirubinemia -Secondary to liver mass and intrahepatic obstructions -As per recommendation from GI will initiate prophylactic antibiotics  Alcohol abuse -Currently there is no symptoms signs of acute alcohol withdrawal.  Start CIWA protocol with as needed benzos  HTN -Hold off home BP meds -Start as needed hydralazine   HLD -Hold off statin until LFTs improves  DVT prophylaxis: SCD Code Status: Full code Family Communication: Daughter at bedside Disposition Plan: Expect less than 2 midnight  hospital Consults called: GI, oncology, IR Admission status: PCU observation  Cort ONEIDA Mana MD [facility] Pager [phone]  09/28/2023, 3:51 PM

## 2023-09-28 NOTE — Progress Notes (Signed)
 IR has approved patient for LLQ peritoneal mass biopsy. If patient is to be admitted IR will tentatively plan for Monday 10/01/23 or another day early next week depending on IR schedule. No drain is recommended at this time.Dr. Karalee recommends MRI-MRCP with contrast followed by GI consult for elective ERCP.   Brayah Urquilla, AGACNP-BC 09/28/2023, 3:30 PM

## 2023-09-29 ENCOUNTER — Other Ambulatory Visit: Payer: Self-pay

## 2023-09-29 DIAGNOSIS — R7401 Elevation of levels of liver transaminase levels: Secondary | ICD-10-CM

## 2023-09-29 DIAGNOSIS — R17 Unspecified jaundice: Secondary | ICD-10-CM

## 2023-09-29 DIAGNOSIS — Z79899 Other long term (current) drug therapy: Secondary | ICD-10-CM | POA: Diagnosis not present

## 2023-09-29 DIAGNOSIS — R1901 Right upper quadrant abdominal swelling, mass and lump: Secondary | ICD-10-CM

## 2023-09-29 DIAGNOSIS — M549 Dorsalgia, unspecified: Secondary | ICD-10-CM | POA: Diagnosis present

## 2023-09-29 DIAGNOSIS — G8929 Other chronic pain: Secondary | ICD-10-CM | POA: Diagnosis present

## 2023-09-29 DIAGNOSIS — D649 Anemia, unspecified: Secondary | ICD-10-CM

## 2023-09-29 DIAGNOSIS — E785 Hyperlipidemia, unspecified: Secondary | ICD-10-CM | POA: Diagnosis present

## 2023-09-29 DIAGNOSIS — I10 Essential (primary) hypertension: Secondary | ICD-10-CM | POA: Diagnosis present

## 2023-09-29 DIAGNOSIS — R160 Hepatomegaly, not elsewhere classified: Secondary | ICD-10-CM

## 2023-09-29 DIAGNOSIS — Z79891 Long term (current) use of opiate analgesic: Secondary | ICD-10-CM | POA: Diagnosis not present

## 2023-09-29 DIAGNOSIS — E663 Overweight: Secondary | ICD-10-CM | POA: Diagnosis present

## 2023-09-29 DIAGNOSIS — I959 Hypotension, unspecified: Secondary | ICD-10-CM

## 2023-09-29 DIAGNOSIS — Z1152 Encounter for screening for COVID-19: Secondary | ICD-10-CM | POA: Diagnosis not present

## 2023-09-29 DIAGNOSIS — F1010 Alcohol abuse, uncomplicated: Secondary | ICD-10-CM

## 2023-09-29 DIAGNOSIS — C221 Intrahepatic bile duct carcinoma: Secondary | ICD-10-CM | POA: Diagnosis present

## 2023-09-29 DIAGNOSIS — I493 Ventricular premature depolarization: Secondary | ICD-10-CM | POA: Diagnosis present

## 2023-09-29 DIAGNOSIS — R55 Syncope and collapse: Secondary | ICD-10-CM | POA: Diagnosis present

## 2023-09-29 DIAGNOSIS — Z7982 Long term (current) use of aspirin: Secondary | ICD-10-CM | POA: Diagnosis not present

## 2023-09-29 DIAGNOSIS — F17290 Nicotine dependence, other tobacco product, uncomplicated: Secondary | ICD-10-CM | POA: Diagnosis present

## 2023-09-29 LAB — RETICULOCYTES
Immature Retic Fract: 10.4 % (ref 2.3–15.9)
RBC.: 2.3 MIL/uL — ABNORMAL LOW (ref 4.22–5.81)
Retic Count, Absolute: 58.6 10*3/uL (ref 19.0–186.0)
Retic Ct Pct: 2.6 % (ref 0.4–3.1)

## 2023-09-29 LAB — CBC
HCT: 23 % — ABNORMAL LOW (ref 39.0–52.0)
Hemoglobin: 7.8 g/dL — ABNORMAL LOW (ref 13.0–17.0)
MCH: 32.9 pg (ref 26.0–34.0)
MCHC: 33.9 g/dL (ref 30.0–36.0)
MCV: 97 fL (ref 80.0–100.0)
Platelets: 211 10*3/uL (ref 150–400)
RBC: 2.37 MIL/uL — ABNORMAL LOW (ref 4.22–5.81)
RDW: 13.2 % (ref 11.5–15.5)
WBC: 7.8 10*3/uL (ref 4.0–10.5)
nRBC: 0 % (ref 0.0–0.2)

## 2023-09-29 LAB — COMPREHENSIVE METABOLIC PANEL
ALT: 95 U/L — ABNORMAL HIGH (ref 0–44)
AST: 66 U/L — ABNORMAL HIGH (ref 15–41)
Albumin: 3 g/dL — ABNORMAL LOW (ref 3.5–5.0)
Alkaline Phosphatase: 156 U/L — ABNORMAL HIGH (ref 38–126)
Anion gap: 8 (ref 5–15)
BUN: 43 mg/dL — ABNORMAL HIGH (ref 8–23)
CO2: 21 mmol/L — ABNORMAL LOW (ref 22–32)
Calcium: 8.1 mg/dL — ABNORMAL LOW (ref 8.9–10.3)
Chloride: 107 mmol/L (ref 98–111)
Creatinine, Ser: 1.03 mg/dL (ref 0.61–1.24)
GFR, Estimated: >60 mL/min (ref >60)
Glucose, Bld: 110 mg/dL — ABNORMAL HIGH (ref 70–99)
Potassium: 3.4 mmol/L — ABNORMAL LOW (ref 3.5–5.1)
Sodium: 136 mmol/L (ref 135–145)
Total Bilirubin: 2.7 mg/dL — ABNORMAL HIGH (ref 0.0–1.2)
Total Protein: 5.9 g/dL — ABNORMAL LOW (ref 6.5–8.1)

## 2023-09-29 LAB — IRON AND TIBC
Iron: 255 ug/dL — ABNORMAL HIGH (ref 45–182)
Saturation Ratios: 87 % — ABNORMAL HIGH (ref 17.9–39.5)
TIBC: 294 ug/dL (ref 250–450)
UIBC: 39 ug/dL

## 2023-09-29 LAB — URINALYSIS, W/ REFLEX TO CULTURE (INFECTION SUSPECTED)
Bacteria, UA: NONE SEEN
Bilirubin Urine: NEGATIVE
Glucose, UA: NEGATIVE mg/dL
Hgb urine dipstick: NEGATIVE
Ketones, ur: NEGATIVE mg/dL
Leukocytes,Ua: NEGATIVE
Nitrite: NEGATIVE
Protein, ur: NEGATIVE mg/dL
Specific Gravity, Urine: 1.02 (ref 1.005–1.030)
pH: 5 (ref 5.0–8.0)

## 2023-09-29 LAB — ABO/RH: ABO/RH(D): O POS

## 2023-09-29 LAB — HIV ANTIBODY (ROUTINE TESTING W REFLEX): HIV Screen 4th Generation wRfx: NONREACTIVE

## 2023-09-29 LAB — HEPATITIS C ANTIBODY: HCV Ab: NONREACTIVE

## 2023-09-29 LAB — CBG MONITORING, ED: Glucose-Capillary: 96 mg/dL (ref 70–99)

## 2023-09-29 LAB — MAGNESIUM: Magnesium: 2.3 mg/dL (ref 1.7–2.4)

## 2023-09-29 LAB — HEMOGLOBIN AND HEMATOCRIT, BLOOD
HCT: 22.4 % — ABNORMAL LOW (ref 39.0–52.0)
Hemoglobin: 7.7 g/dL — ABNORMAL LOW (ref 13.0–17.0)

## 2023-09-29 LAB — FERRITIN: Ferritin: 475 ng/mL — ABNORMAL HIGH (ref 24–336)

## 2023-09-29 LAB — FOLATE: Folate: 18.9 ng/mL (ref >5.9)

## 2023-09-29 LAB — HEPATITIS B SURFACE ANTIGEN: Hepatitis B Surface Ag: NONREACTIVE

## 2023-09-29 MED ORDER — POTASSIUM CHLORIDE 20 MEQ PO PACK
20.0000 meq | PACK | Freq: Once | ORAL | Status: AC
  Administered 2023-09-29: 20 meq via ORAL
  Filled 2023-09-29: qty 1

## 2023-09-29 MED ORDER — MAGNESIUM HYDROXIDE 400 MG/5ML PO SUSP
30.0000 mL | Freq: Every day | ORAL | Status: DC | PRN
  Administered 2023-09-29: 30 mL via ORAL
  Filled 2023-09-29: qty 30

## 2023-09-29 NOTE — Assessment & Plan Note (Signed)
 Likely with poor p.o. intake for the past 2 weeks.  Initially requiring Levophed , which has been weaned off.  Blood pressure currently within goal.  Echocardiogram ordered-pending. Preliminary blood cultures negative. -Continue with supportive care

## 2023-09-29 NOTE — Hospital Course (Addendum)
 Taken from H&P.  Michael Sims is a 63 y.o. male with medical history significant of alcohol abuse, HTN, HLD, chronic back pain on narcotics, presented with worsening of right upper quadrant abdominal pain, poor oral intake and near syncope.  Patient was having symptoms for about 2-week, also has poor appetite, weight loss and dark-colored urine.  Family noticed yellow discoloration of skin. EMS was called as he was feeling very lightheaded with blurry vision on the day of admission, they have found patient's blood pressure in 80s. At baseline patient drinks heavily liquor every day but stopped drinking over the weekend due to stomachache.   On presentation blood pressure 69/46, heart rate in 40s and patient was initially started on Levophed  and received 2 L of IV bolus.  Later Levophed  was discontinued after improvement of blood pressure.  Labs with BUN 56, AST 84, ALT 132, alkaline phosphatase 207, T. bili at 3.8-liver enzymes with slight improvement as compared to the recent check at his cardiology office. CT abdomen pelvis showed moderate intrahepatic bile duct dilation with ill-defined infiltrate soft tissue density at hepatic ileum without discrete measurable mass suggesting suspicious for neoplastic process particularly cholangiocarcinoma.   Gastroenterology, oncology and IR was consulted from ED for further assistance.  IR will likely do right lower quadrant peritoneal mass biopsy on Monday.  Tumor markers were ordered by oncology.  2/8: Vital stable, preliminary blood cultures negative.  Improving transaminitis and T. bili.  Lengthy discussion with family as they want to take him to the [facility], unfortunately transfer has been declined.  Patient is hemodynamically stable and family is willing to take him themselves and requesting discharge tomorrow morning. Hemoglobin this morning with a drastic decrease to 7.8, no obvious bleeding. Repeat hemoglobin again 7.7-ordered anemia panel and type and  screen. Also ordered FOBT.  2/9: Hemoglobin with further decreased to 7.3.  No BM or any obvious bleeding. Haptoglobin and LDH was also ordered.  Ordered 1 unit of PRBC. Anemia panel without any specific deficiency, abnormally normal reticulocyte count.  Family would like to take him to [facility] for further investigation and does not want any further testing except 1 unit of blood before discharging.  Patient was told to hold his statin and antihypertensives until advised by his providers.  Blood pressure currently within goal, at baseline he has pretty significant hypertension and on multiple antihypertensives at home.  Patient is being discharged at his request so he can take a second opinion and further management at [facility].  Family made arrangements. CEA and CA 19-9 levels are still pending.  Metastatic disease is the main concern and higher on differential but need further evaluation for definitive diagnosis.

## 2023-09-29 NOTE — Assessment & Plan Note (Signed)
 Transaminitis. Imaging concerning for metastatic cholangiocarcinoma. CT abdomen concerning for metastatic cholangiocarcinoma, oncology and interventional radiology was consulted. IR can do biopsy of RLQ peritoneal mass on Monday. CEA, CA 19-9 ordered and pending results. Family was requesting transfer to [facility], unfortunately declined as they are at capacity. Family would like to take him over there themself and requesting discharge early morning tomorrow.

## 2023-09-29 NOTE — Assessment & Plan Note (Signed)
 Patient was on multiple antihypertensives at home, initially presented with hypotension, blood pressure currently within goal. -Keep holding home antihypertensives

## 2023-09-29 NOTE — Assessment & Plan Note (Signed)
 No sign of withdrawal at this time. -Continue with CIWA protocol

## 2023-09-29 NOTE — Assessment & Plan Note (Signed)
-  Holding home statin due to transaminitis 

## 2023-09-29 NOTE — Assessment & Plan Note (Signed)
 Seems like slowly declining hemoglobin with a drastic change this morning when hemoglobin came back at 7.8.  No obvious bleeding. Repeat hemoglobin again came back 7.7 -Check FOBT -Type and screen -Monitor hemoglobin -Transfuse if below 7

## 2023-09-29 NOTE — Progress Notes (Addendum)
 Progress Note   Patient: Michael Sims FMW:[id number] DOB: 09-16-1960 DOA: 09/28/2023     0 DOS: the patient was seen and examined on 09/29/2023   Brief hospital course: Taken from H&P.  Michael Sims is a 63 y.o. male with medical history significant of alcohol abuse, HTN, HLD, chronic back pain on narcotics, presented with worsening of right upper quadrant abdominal pain, poor oral intake and near syncope.  Patient was having symptoms for about 2-week, also has poor appetite, weight loss and dark-colored urine.  Family noticed yellow discoloration of skin. EMS was called as he was feeling very lightheaded with blurry vision on the day of admission, they have found patient's blood pressure in 80s. At baseline patient drinks heavily liquor every day but stopped drinking over the weekend due to stomachache.   On presentation blood pressure 69/46, heart rate in 40s and patient was initially started on Levophed  and received 2 L of IV bolus.  Later Levophed  was discontinued after improvement of blood pressure.  Labs with BUN 56, AST 84, ALT 132, alkaline phosphatase 207, T. bili at 3.8-liver enzymes with slight improvement as compared to the recent check at his cardiology office. CT abdomen pelvis showed moderate intrahepatic bile duct dilation with ill-defined infiltrate soft tissue density at hepatic ileum without discrete measurable mass suggesting suspicious for neoplastic process particularly cholangiocarcinoma.   Gastroenterology, oncology and IR was consulted from ED for further assistance.  IR will likely do right lower quadrant peritoneal mass biopsy on Monday.  Tumor markers were ordered by oncology.  2/8: Vital stable, preliminary blood cultures negative.  Improving transaminitis and T. bili.  Lengthy discussion with family as they want to take him to the [facility], unfortunately transfer has been declined.  Patient is hemodynamically stable and family is willing to take him themselves and  requesting discharge tomorrow morning. Hemoglobin this morning with a drastic decrease to 7.8, no obvious bleeding. Repeat hemoglobin again 7.7-ordered anemia panel and type and screen. Also ordered FOBT.   Assessment and Plan: * Syncope Likely with poor p.o. intake for the past 2 weeks.  Initially requiring Levophed , which has been weaned off.  Blood pressure currently within goal.  Echocardiogram ordered-pending. Preliminary blood cultures negative. -Continue with supportive care  Normocytic anemia Seems like slowly declining hemoglobin with a drastic change this morning when hemoglobin came back at 7.8.  No obvious bleeding. Repeat hemoglobin again came back 7.7 -Check FOBT -Type and screen -Monitor hemoglobin -Transfuse if below 7  Liver mass Transaminitis. Imaging concerning for metastatic cholangiocarcinoma. CT abdomen concerning for metastatic cholangiocarcinoma, oncology and interventional radiology was consulted. IR can do biopsy of RLQ peritoneal mass on Monday. CEA, CA 19-9 ordered and pending results. Family was requesting transfer to [facility], unfortunately declined as they are at capacity. Family would like to take him over there themself and requesting discharge early morning tomorrow.  Alcohol abuse No sign of withdrawal at this time. -Continue with CIWA protocol  Essential hypertension Patient was on multiple antihypertensives at home, initially presented with hypotension, blood pressure currently within goal. -Keep holding home antihypertensives  Hyperlipidemia -Holding home statin due to transaminitis   Subjective: Patient was seen and examined today.  Denies any pain or dizziness at this time.  Daughter and another family member at bedside who are helping with interpretation.  Physical Exam: Vitals:   09/29/23 1000 09/29/23 1230 09/29/23 1339 09/29/23 1412  BP: (!) 113/58  116/62 116/62  Pulse: (!) 58  62 62  Resp: 20  16  Temp:  98.1 F (36.7 C)     TempSrc:  Oral    SpO2: 96%  100%    General.  Overweight gentleman, in no acute distress. Pulmonary.  Lungs clear bilaterally, normal respiratory effort. CV.  Regular rate and rhythm, no JVD, rub or murmur. Abdomen.  Soft, nontender, nondistended, BS positive. CNS.  Alert and oriented .  No focal neurologic deficit. Extremities.  No edema, no cyanosis, pulses intact and symmetrical. Psychiatry.  Judgment and insight appears normal.   Data Reviewed: Prior data reviewed  Family Communication: Discussed with daughter and another family member at bedside  Disposition: Status is: Observation The patient remains OBS appropriate and will d/c before 2 midnights.  Planned Discharge Destination: Home  Time spent: 50 minutes  This record has been created using Conservation officer, historic buildings. Errors have been sought and corrected,but may not always be located. Such creation errors do not reflect on the standard of care.   Author: Amaryllis Dare, MD 09/29/2023 2:33 PM  For on call review [email or web address].

## 2023-09-29 NOTE — ED Notes (Signed)
 Notified MD Luna Salinas of pt family request for updated bilirubin level lab order.

## 2023-09-29 NOTE — ED Notes (Signed)
 Per CT Kayleen Party they will powershare CT images to [facility].

## 2023-09-29 NOTE — ED Notes (Signed)
 Requested disc of CT ABD Rachel CT.

## 2023-09-29 NOTE — ED Notes (Signed)
 Daughter requesting transfer to [facility].  She states that she was told to ask everyday. MD Luna Salinas notified.

## 2023-09-30 ENCOUNTER — Inpatient Hospital Stay: Payer: 59

## 2023-09-30 DIAGNOSIS — R55 Syncope and collapse: Secondary | ICD-10-CM | POA: Diagnosis not present

## 2023-09-30 DIAGNOSIS — R160 Hepatomegaly, not elsewhere classified: Secondary | ICD-10-CM | POA: Diagnosis not present

## 2023-09-30 DIAGNOSIS — I959 Hypotension, unspecified: Secondary | ICD-10-CM | POA: Diagnosis not present

## 2023-09-30 DIAGNOSIS — R1901 Right upper quadrant abdominal swelling, mass and lump: Secondary | ICD-10-CM | POA: Diagnosis not present

## 2023-09-30 DIAGNOSIS — D649 Anemia, unspecified: Secondary | ICD-10-CM

## 2023-09-30 LAB — CBC
HCT: 21.2 % — ABNORMAL LOW (ref 39.0–52.0)
Hemoglobin: 7.3 g/dL — ABNORMAL LOW (ref 13.0–17.0)
MCH: 32.9 pg (ref 26.0–34.0)
MCHC: 34.4 g/dL (ref 30.0–36.0)
MCV: 95.5 fL (ref 80.0–100.0)
Platelets: 205 10*3/uL (ref 150–400)
RBC: 2.22 MIL/uL — ABNORMAL LOW (ref 4.22–5.81)
RDW: 12.7 % (ref 11.5–15.5)
WBC: 6.8 10*3/uL (ref 4.0–10.5)
nRBC: 0 % (ref 0.0–0.2)

## 2023-09-30 LAB — COMPREHENSIVE METABOLIC PANEL
ALT: 109 U/L — ABNORMAL HIGH (ref 0–44)
AST: 80 U/L — ABNORMAL HIGH (ref 15–41)
Albumin: 3.1 g/dL — ABNORMAL LOW (ref 3.5–5.0)
Alkaline Phosphatase: 169 U/L — ABNORMAL HIGH (ref 38–126)
Anion gap: 10 (ref 5–15)
BUN: 25 mg/dL — ABNORMAL HIGH (ref 8–23)
CO2: 22 mmol/L (ref 22–32)
Calcium: 8.6 mg/dL — ABNORMAL LOW (ref 8.9–10.3)
Chloride: 106 mmol/L (ref 98–111)
Creatinine, Ser: 0.93 mg/dL (ref 0.61–1.24)
GFR, Estimated: >60 mL/min (ref >60)
Glucose, Bld: 108 mg/dL — ABNORMAL HIGH (ref 70–99)
Potassium: 3.8 mmol/L (ref 3.5–5.1)
Sodium: 138 mmol/L (ref 135–145)
Total Bilirubin: 2.1 mg/dL — ABNORMAL HIGH (ref 0.0–1.2)
Total Protein: 6.2 g/dL — ABNORMAL LOW (ref 6.5–8.1)

## 2023-09-30 LAB — VITAMIN B12: Vitamin B-12: 1363 pg/mL — ABNORMAL HIGH (ref 180–914)

## 2023-09-30 LAB — PREPARE RBC (CROSSMATCH)

## 2023-09-30 LAB — CEA: CEA: 17.2 ng/mL — ABNORMAL HIGH (ref 0.0–4.7)

## 2023-09-30 MED ORDER — SODIUM CHLORIDE 0.9% IV SOLUTION
Freq: Once | INTRAVENOUS | Status: DC
  Filled 2023-09-30: qty 250

## 2023-09-30 MED ORDER — EPLERENONE 50 MG PO TABS: ORAL_TABLET | ORAL | 7 refills | Status: AC

## 2023-09-30 MED ORDER — AMLODIPINE BESYLATE 10 MG PO TABS: ORAL_TABLET | ORAL | 11 refills | Status: AC

## 2023-09-30 MED ORDER — SODIUM CHLORIDE 0.9 % IV SOLN: Freq: Once | INTRAVENOUS | Status: DC

## 2023-09-30 MED ORDER — VALSARTAN 320 MG PO TABS: 320.0000 mg | ORAL_TABLET | Freq: Every day | ORAL | 0 refills | Status: AC

## 2023-09-30 MED ORDER — ATORVASTATIN CALCIUM 40 MG PO TABS: 40.0000 mg | ORAL_TABLET | Freq: Every day | ORAL | 3 refills | Status: AC

## 2023-09-30 MED ORDER — SODIUM CHLORIDE 0.9% IV SOLUTION: Freq: Once | INTRAVENOUS | Status: DC

## 2023-09-30 NOTE — ED Notes (Signed)
 Pt's daughter requested for the patient to take a shower prior to being discharged home.  Since there are no patient showers in the emergency department. Daughter notified that we could provide a wash basin and towels for him to do a bed bath prior to dispo. Pt and daughter okay with this compromise. Will pass the memo on to day shift. Supplied gathered and left at nursing station as patient/family are asleep at this time. (0530 AM)

## 2023-09-30 NOTE — Discharge Summary (Signed)
 Physician Discharge Summary   Patient: Michael Sims MRN: [id number] DOB: 01/25/1961  Admit date:     09/28/2023  Discharge date: 09/30/23  Discharge Physician: Amaryllis Dare   PCP: Patient, No Pcp Per   Recommendations at discharge:  Patient is going to [facility] for second opinion and further management. He was told to hold antihypertensives and statin-please restart as appropriate.  Discharge Diagnoses: Principal Problem:   Syncope Active Problems:   Liver mass   Normocytic anemia   Alcohol abuse   Essential hypertension   Hyperlipidemia   Abdominal mass, right upper quadrant   Elevated bilirubin   Hypotension   Transaminitis  Resolved Problems:   * No resolved hospital problems. [facility] Course: Taken from H&P.  Michael Sims is a 63 y.o. male with medical history significant of alcohol abuse, HTN, HLD, chronic back pain on narcotics, presented with worsening of right upper quadrant abdominal pain, poor oral intake and near syncope.  Patient was having symptoms for about 2-week, also has poor appetite, weight loss and dark-colored urine.  Family noticed yellow discoloration of skin. EMS was called as he was feeling very lightheaded with blurry vision on the day of admission, they have found patient's blood pressure in 80s. At baseline patient drinks heavily liquor every day but stopped drinking over the weekend due to stomachache.   On presentation blood pressure 69/46, heart rate in 40s and patient was initially started on Levophed  and received 2 L of IV bolus.  Later Levophed  was discontinued after improvement of blood pressure.  Labs with BUN 56, AST 84, ALT 132, alkaline phosphatase 207, T. bili at 3.8-liver enzymes with slight improvement as compared to the recent check at his cardiology office. CT abdomen pelvis showed moderate intrahepatic bile duct dilation with ill-defined infiltrate soft tissue density at hepatic ileum without discrete measurable mass suggesting  suspicious for neoplastic process particularly cholangiocarcinoma.   Gastroenterology, oncology and IR was consulted from ED for further assistance.  IR will likely do right lower quadrant peritoneal mass biopsy on Monday.  Tumor markers were ordered by oncology.  2/8: Vital stable, preliminary blood cultures negative.  Improving transaminitis and T. bili.  Lengthy discussion with family as they want to take him to the [facility], unfortunately transfer has been declined.  Patient is hemodynamically stable and family is willing to take him themselves and requesting discharge tomorrow morning. Hemoglobin this morning with a drastic decrease to 7.8, no obvious bleeding. Repeat hemoglobin again 7.7-ordered anemia panel and type and screen. Also ordered FOBT.  2/9: Hemoglobin with further decreased to 7.3.  No BM or any obvious bleeding. Haptoglobin and LDH was also ordered.  Ordered 1 unit of PRBC. Anemia panel without any specific deficiency, abnormally normal reticulocyte count.  Family would like to take him to [facility] for further investigation and does not want any further testing except 1 unit of blood before discharging.  Patient was told to hold his statin and antihypertensives until advised by his providers.  Blood pressure currently within goal, at baseline he has pretty significant hypertension and on multiple antihypertensives at home.  Patient is being discharged at his request so he can take a second opinion and further management at [facility].  Family made arrangements. CEA and CA 19-9 levels are still pending.  Metastatic disease is the main concern and higher on differential but need further evaluation for definitive diagnosis.  Assessment and Plan: * Syncope Likely with poor p.o. intake for the past 2 weeks.  Initially requiring  Levophed , which has been weaned off.  Blood pressure currently within goal.  Echocardiogram ordered-pending. Preliminary blood cultures negative. -Continue with  supportive care  Normocytic anemia Persistent significant decline in hemoglobin, at 7.3 this morning.  No obvious bleeding.  Hemolysis labs ordered but deferred as they want further investigation at [facility]. -Check FOBT -Patient received 1 unit of PRBC before leaving  Liver mass Transaminitis. Imaging concerning for metastatic cholangiocarcinoma. CT abdomen concerning for metastatic cholangiocarcinoma, oncology and interventional radiology was consulted. IR can do biopsy of RLQ peritoneal mass on Monday. CEA, CA 19-9 ordered and pending results. Family was requesting transfer to [facility], unfortunately declined as they are at capacity. Family would like to take him over there themself and requesting discharge.  Alcohol abuse No sign of withdrawal at this time. -Continue with CIWA protocol  Essential hypertension Patient was on multiple antihypertensives at home, initially presented with hypotension, blood pressure currently within goal. -Keep holding home antihypertensives  Hyperlipidemia -Holding home statin due to transaminitis   Consultants: Oncology.  Interventional radiology Procedures performed: None Disposition: Home Diet recommendation:  Discharge Diet Orders (From admission, onward)     Start     Ordered   09/30/23 0000  Diet - low sodium heart healthy        09/30/23 0920           Cardiac diet DISCHARGE MEDICATION: Allergies as of 09/30/2023   No Known Allergies      Medication List     TAKE these medications    amLODipine  10 MG tablet Commonly known as: NORVASC  TAKE 1 TABLET BY MOUTH DAILY, hold until recommended by your physician What changed: additional instructions   aspirin  EC 81 MG tablet Take 1 tablet (81 mg total) by mouth daily. Swallow whole.   atorvastatin  40 MG tablet Commonly known as: LIPITOR Take 1 tablet (40 mg total) by mouth daily. Hold until liver function normalized What changed: additional instructions   eplerenone  50 MG  tablet Commonly known as: INSPRA  TAKE 1 TABLET BY MOUTH EVERY DAY, hold until seen by your doctor What changed: additional instructions   valsartan  320 MG tablet Commonly known as: DIOVAN  Take 1 tablet (320 mg total) by mouth daily. Hold until seen by your doctor What changed: additional instructions        Discharge Exam: There were no vitals filed for this visit. General.  Overweight gentleman, in no acute distress.  Mild skin pallor and scleral icterus Pulmonary.  Lungs clear bilaterally, normal respiratory effort. CV.  Regular rate and rhythm, no JVD, rub or murmur. Abdomen.  Soft, nontender, nondistended, BS positive. CNS.  Alert and oriented .  No focal neurologic deficit. Extremities.  No edema, no cyanosis, pulses intact and symmetrical. Psychiatry.  Judgment and insight appears normal.   Condition at discharge: stable  The results of significant diagnostics from this hospitalization (including imaging, microbiology, ancillary and laboratory) are listed below for reference.   Imaging Studies: CT ABDOMEN PELVIS W CONTRAST Result Date: 09/28/2023 CLINICAL DATA:  Generalized weakness and jaundice. * Tracking Code: BO * EXAM: CT ABDOMEN AND PELVIS WITH CONTRAST TECHNIQUE: Multidetector CT imaging of the abdomen and pelvis was performed using the standard protocol following bolus administration of intravenous contrast. RADIATION DOSE REDUCTION: This exam was performed according to the departmental dose-optimization program which includes automated exposure control, adjustment of the mA and/or kV according to patient size and/or use of iterative reconstruction technique. CONTRAST:  100mL OMNIPAQUE  IOHEXOL  300 MG/ML  SOLN COMPARISON:  None Available.  FINDINGS: Lower chest: No focal consolidation or pulmonary nodule in the lung bases. No pleural effusion or pneumothorax demonstrated. Partially imaged heart size is normal. Hepatobiliary: No focal hepatic lesions. Moderate intrahepatic  bile duct dilation. No common bile duct dilation. Ill-defined, infiltrative soft tissue density at the hepatic hilum. No discretely measurable mass. Decompressed gallbladder with cholelithiasis. Mild pericholecystic stranding. Pancreas: No focal lesions or main ductal dilation. Spleen: Normal in size without focal abnormality. Adrenals/Urinary Tract: No adrenal nodules. No suspicious renal mass, calculi or hydronephrosis. No focal bladder wall thickening. Stomach/Bowel: Normal appearance of the stomach. No evidence of bowel wall thickening, distention, or inflammatory changes. Normal appendix. Vascular/Lymphatic: Aortic atherosclerosis. Innumerable peritoneal nodules, many of which are inseparable from bowel loops for example: -2.8 x 2.1 cm retroperitoneal adjacent to the third portion of the duodenum (2:33) -1.4 x 1.1 cm left paracolic gutter (7:40) -8 mm abutting anterior hepatic segment 2/3 (2:20) Reproductive: Prostate is unremarkable. Other: No free fluid, fluid collection, or free air. Musculoskeletal: No acute or abnormal lytic or blastic osseous lesions. Multilevel degenerative changes of the partially imaged thoracic and lumbar spine. IMPRESSION: 1. Moderate intrahepatic bile duct dilation with ill-defined, infiltrative soft tissue density at the hepatic hilum without discretely measurable mass. Findings are suspicious for neoplastic process, particularly cholangiocarcinoma. 2. Innumerable peritoneal nodules, many inseparable from bowel loops, suspicious for metastases. 3.  Aortic Atherosclerosis (ICD10-I70.0). Electronically Signed   By: Limin  Xu M.D.   On: 09/28/2023 10:34    Microbiology: Results for orders placed or performed during the hospital encounter of 09/28/23  Resp panel by RT-PCR (RSV, Flu A&B, Covid) Anterior Nasal Swab     Status: None   Collection Time: 09/28/23  9:01 AM   Specimen: Anterior Nasal Swab  Result Value Ref Range Status   SARS Coronavirus 2 by RT PCR NEGATIVE NEGATIVE  Final    Comment: (NOTE) SARS-CoV-2 target nucleic acids are NOT DETECTED.  The SARS-CoV-2 RNA is generally detectable in upper respiratory specimens during the acute phase of infection. The lowest concentration of SARS-CoV-2 viral copies this assay can detect is 138 copies/mL. A negative result does not preclude SARS-Cov-2 infection and should not be used as the sole basis for treatment or other patient management decisions. A negative result may occur with  improper specimen collection/handling, submission of specimen other than nasopharyngeal swab, presence of viral mutation(s) within the areas targeted by this assay, and inadequate number of viral copies(<138 copies/mL). A negative result must be combined with clinical observations, patient history, and epidemiological information. The expected result is Negative.  Fact Sheet for Patients:  bloggercourse.com  Fact Sheet for Healthcare Providers:  seriousbroker.it  This test is no t yet approved or cleared by the [place]  FDA and  has been authorized for detection and/or diagnosis of SARS-CoV-2 by FDA under an Emergency Use Authorization (EUA). This EUA will remain  in effect (meaning this test can be used) for the duration of the COVID-19 declaration under Section 564(b)(1) of the Act, 21 U.S.C.section 360bbb-3(b)(1), unless the authorization is terminated  or revoked sooner.       Influenza A by PCR NEGATIVE NEGATIVE Final   Influenza B by PCR NEGATIVE NEGATIVE Final    Comment: (NOTE) The Xpert Xpress SARS-CoV-2/FLU/RSV plus assay is intended as an aid in the diagnosis of influenza from Nasopharyngeal swab specimens and should not be used as a sole basis for treatment. Nasal washings and aspirates are unacceptable for Xpert Xpress SARS-CoV-2/FLU/RSV testing.  Fact Sheet for Patients: bloggercourse.com  Fact Sheet for Healthcare  Providers: seriousbroker.it  This test is not yet approved or cleared by the [place]  FDA and has been authorized for detection and/or diagnosis of SARS-CoV-2 by FDA under an Emergency Use Authorization (EUA). This EUA will remain in effect (meaning this test can be used) for the duration of the COVID-19 declaration under Section 564(b)(1) of the Act, 21 U.S.C. section 360bbb-3(b)(1), unless the authorization is terminated or revoked.     Resp Syncytial Virus by PCR NEGATIVE NEGATIVE Final    Comment: (NOTE) Fact Sheet for Patients: bloggercourse.com  Fact Sheet for Healthcare Providers: seriousbroker.it  This test is not yet approved or cleared by the [place]  FDA and has been authorized for detection and/or diagnosis of SARS-CoV-2 by FDA under an Emergency Use Authorization (EUA). This EUA will remain in effect (meaning this test can be used) for the duration of the COVID-19 declaration under Section 564(b)(1) of the Act, 21 U.S.C. section 360bbb-3(b)(1), unless the authorization is terminated or revoked.  Performed at [facility], [address] [place], [place] [address]   Blood Culture (routine x 2)     Status: None (Preliminary result)   Collection Time: 09/28/23  9:01 AM   Specimen: BLOOD  Result Value Ref Range Status   Specimen Description BLOOD RIGHT ANTECUBITAL  Final   Special Requests   Final    BOTTLES DRAWN AEROBIC AND ANAEROBIC Blood Culture results may not be optimal due to an inadequate volume of blood received in culture bottles   Culture   Final    NO GROWTH 2 DAYS Performed at [facility], [address] [place], [place] [address]    Report Status PENDING  Incomplete  Blood Culture (routine x 2)     Status: None (Preliminary result)   Collection Time: 09/28/23  9:06 AM   Specimen: BLOOD  Result Value Ref Range Status   Specimen Description  BLOOD LEFT ANTECUBITAL  Final   Special Requests   Final    BOTTLES DRAWN AEROBIC AND ANAEROBIC Blood Culture results may not be optimal due to an inadequate volume of blood received in culture bottles   Culture   Final    NO GROWTH 2 DAYS Performed at [facility], [address] [place], [place] [address]    Report Status PENDING  Incomplete    Labs: CBC: Recent Labs  Lab 09/27/23 1147 09/28/23 0856 09/29/23 0857 09/29/23 1252 09/30/23 0429  WBC 7.3 8.8 7.8  --  6.8  NEUTROABS  --  5.1  --   --   --   HGB 13.8 11.7* 7.8* 7.7* 7.3*  HCT 41.0 34.5* 23.0* 22.4* 21.2*  MCV 95 95.0 97.0  --  95.5  PLT 297 294 211  --  205   Basic Metabolic Panel: Recent Labs  Lab 09/27/23 1147 09/28/23 0856 09/29/23 0857 09/30/23 0429  NA 136 135 136 138  K 4.3 3.9 3.4* 3.8  CL 99 103 107 106  CO2 23 22 21* 22  GLUCOSE 118* 140* 110* 108*  BUN 41* 56* 43* 25*  CREATININE 1.22 1.14 1.03 0.93  CALCIUM  9.4 8.9 8.1* 8.6*  MG  --   --  2.3  --    Liver Function Tests: Recent Labs  Lab 09/27/23 1147 09/28/23 0856 09/29/23 0857 09/30/23 0429  AST 85* 84* 66* 80*  ALT 155* 132* 95* 109*  ALKPHOS 315* 207* 156* 169*  BILITOT 5.4* 3.8* 2.7* 2.1*  PROT 7.0 6.9 5.9* 6.2*  ALBUMIN 4.2 3.5 3.0* 3.1*   CBG: Recent Labs  Lab 09/28/23 0854 09/28/23 2152 09/29/23 0738  GLUCAP 130* 130* 96    Discharge time spent: greater than 30 minutes.  This record has been created using Conservation officer, historic buildings. Errors have been sought and corrected,but may not always be located. Such creation errors do not reflect on the standard of care.   Signed: Amaryllis Dare, MD [facility] 09/30/2023

## 2023-09-30 NOTE — Progress Notes (Signed)
 Consult received for substance abuse resources. Resources added to patient's discharge instructions. Per chart review, patient going to [facility] for a second opinion. Patient left the unit before CSW was able to assess for additional community resource needs.   Othal Kubitz, LCSW Transition of Care

## 2023-09-30 NOTE — ED Notes (Signed)
 CT san refused. Luna Salinas, MD notified.

## 2023-09-30 NOTE — Discharge Instructions (Signed)

## 2023-10-01 LAB — TYPE AND SCREEN
ABO/RH(D): O POS
Antibody Screen: NEGATIVE
Unit division: 0

## 2023-10-01 LAB — BPAM RBC
Blood Product Expiration Date: 202503092359
ISSUE DATE / TIME: 202502090749
Unit Type and Rh: 5100

## 2023-10-01 LAB — CA 19-9 (SERIAL): CA 19-9: 1876 U/mL — ABNORMAL HIGH (ref 0–35)

## 2023-10-03 LAB — CULTURE, BLOOD (ROUTINE X 2)
Culture: NO GROWTH
Culture: NO GROWTH

## 2023-10-15 ENCOUNTER — Ambulatory Visit: Payer: 59

## 2023-10-23 ENCOUNTER — Other Ambulatory Visit: Payer: Self-pay | Admitting: Cardiovascular Disease

## 2023-10-23 DIAGNOSIS — C801 Malignant (primary) neoplasm, unspecified: Secondary | ICD-10-CM

## 2023-10-24 ENCOUNTER — Other Ambulatory Visit: Payer: Self-pay | Admitting: *Deleted

## 2023-10-24 DIAGNOSIS — C801 Malignant (primary) neoplasm, unspecified: Secondary | ICD-10-CM

## 2023-10-25 LAB — CANCER ANTIGEN 19-9: CA 19-9: 4869 U/mL — ABNORMAL HIGH (ref 0–35)

## 2023-10-25 LAB — CEA: CEA: 22.6 ng/mL — ABNORMAL HIGH (ref 0.0–4.7)

## 2023-11-01 ENCOUNTER — Telehealth: Payer: Self-pay | Admitting: Pharmacy Technician

## 2023-11-01 ENCOUNTER — Other Ambulatory Visit (HOSPITAL_COMMUNITY): Payer: Self-pay

## 2023-11-01 NOTE — Telephone Encounter (Signed)
 Pharmacy Patient Advocate Encounter   Received notification from CoverMyMeds that prior authorization for rosuvastatin is required/requested.   Insurance verification completed.   The patient is insured through [facility] .   Per test claim: The current 11/01/23 day co-pay is, $30.00- 3 months.  No PA needed at this time. This test claim was processed through [facility]- copay amounts may vary at other pharmacies due to pharmacy/plan contracts, or as the patient moves through the different stages of their insurance plan.

## 2023-11-21 DIAGNOSIS — C799 Secondary malignant neoplasm of unspecified site: Secondary | ICD-10-CM | POA: Diagnosis not present

## 2023-11-21 DIAGNOSIS — C23 Malignant neoplasm of gallbladder: Secondary | ICD-10-CM | POA: Diagnosis not present

## 2023-11-22 DIAGNOSIS — C23 Malignant neoplasm of gallbladder: Secondary | ICD-10-CM | POA: Diagnosis not present

## 2023-11-29 DIAGNOSIS — C801 Malignant (primary) neoplasm, unspecified: Secondary | ICD-10-CM | POA: Diagnosis not present

## 2023-11-29 DIAGNOSIS — C786 Secondary malignant neoplasm of retroperitoneum and peritoneum: Secondary | ICD-10-CM | POA: Diagnosis not present

## 2023-11-29 DIAGNOSIS — K831 Obstruction of bile duct: Secondary | ICD-10-CM | POA: Diagnosis not present

## 2023-11-29 DIAGNOSIS — I10 Essential (primary) hypertension: Secondary | ICD-10-CM | POA: Diagnosis not present

## 2023-11-29 DIAGNOSIS — C23 Malignant neoplasm of gallbladder: Secondary | ICD-10-CM | POA: Diagnosis not present

## 2023-12-03 DIAGNOSIS — Z4659 Encounter for fitting and adjustment of other gastrointestinal appliance and device: Secondary | ICD-10-CM | POA: Diagnosis not present

## 2023-12-03 DIAGNOSIS — Z8509 Personal history of malignant neoplasm of other digestive organs: Secondary | ICD-10-CM | POA: Diagnosis not present

## 2023-12-03 DIAGNOSIS — I10 Essential (primary) hypertension: Secondary | ICD-10-CM | POA: Diagnosis not present

## 2023-12-03 DIAGNOSIS — K831 Obstruction of bile duct: Secondary | ICD-10-CM | POA: Diagnosis not present

## 2023-12-03 DIAGNOSIS — K838 Other specified diseases of biliary tract: Secondary | ICD-10-CM | POA: Diagnosis not present

## 2023-12-06 DIAGNOSIS — C23 Malignant neoplasm of gallbladder: Secondary | ICD-10-CM | POA: Diagnosis not present

## 2023-12-09 ENCOUNTER — Other Ambulatory Visit: Payer: Self-pay | Admitting: Cardiovascular Disease

## 2023-12-09 DIAGNOSIS — E785 Hyperlipidemia, unspecified: Secondary | ICD-10-CM

## 2023-12-14 DIAGNOSIS — C786 Secondary malignant neoplasm of retroperitoneum and peritoneum: Secondary | ICD-10-CM | POA: Diagnosis not present

## 2023-12-14 DIAGNOSIS — C23 Malignant neoplasm of gallbladder: Secondary | ICD-10-CM | POA: Diagnosis not present

## 2023-12-19 DIAGNOSIS — C23 Malignant neoplasm of gallbladder: Secondary | ICD-10-CM | POA: Diagnosis not present

## 2023-12-19 DIAGNOSIS — K838 Other specified diseases of biliary tract: Secondary | ICD-10-CM | POA: Diagnosis not present

## 2023-12-19 DIAGNOSIS — C786 Secondary malignant neoplasm of retroperitoneum and peritoneum: Secondary | ICD-10-CM | POA: Diagnosis not present

## 2023-12-20 ENCOUNTER — Ambulatory Visit: Payer: Self-pay | Admitting: Cardiovascular Disease

## 2023-12-21 DIAGNOSIS — C23 Malignant neoplasm of gallbladder: Secondary | ICD-10-CM | POA: Diagnosis not present

## 2023-12-21 DIAGNOSIS — C786 Secondary malignant neoplasm of retroperitoneum and peritoneum: Secondary | ICD-10-CM | POA: Diagnosis not present

## 2023-12-28 DIAGNOSIS — C23 Malignant neoplasm of gallbladder: Secondary | ICD-10-CM | POA: Diagnosis not present

## 2023-12-31 DIAGNOSIS — K831 Obstruction of bile duct: Secondary | ICD-10-CM | POA: Diagnosis not present

## 2023-12-31 DIAGNOSIS — C786 Secondary malignant neoplasm of retroperitoneum and peritoneum: Secondary | ICD-10-CM | POA: Diagnosis not present

## 2023-12-31 DIAGNOSIS — R591 Generalized enlarged lymph nodes: Secondary | ICD-10-CM | POA: Diagnosis not present

## 2023-12-31 DIAGNOSIS — R509 Fever, unspecified: Secondary | ICD-10-CM | POA: Diagnosis not present

## 2023-12-31 DIAGNOSIS — D72829 Elevated white blood cell count, unspecified: Secondary | ICD-10-CM | POA: Diagnosis not present

## 2023-12-31 DIAGNOSIS — K8309 Other cholangitis: Secondary | ICD-10-CM | POA: Diagnosis not present

## 2023-12-31 DIAGNOSIS — R0600 Dyspnea, unspecified: Secondary | ICD-10-CM | POA: Diagnosis not present

## 2023-12-31 DIAGNOSIS — E873 Alkalosis: Secondary | ICD-10-CM | POA: Diagnosis not present

## 2023-12-31 DIAGNOSIS — C23 Malignant neoplasm of gallbladder: Secondary | ICD-10-CM | POA: Diagnosis not present

## 2023-12-31 DIAGNOSIS — K838 Other specified diseases of biliary tract: Secondary | ICD-10-CM | POA: Diagnosis not present

## 2023-12-31 DIAGNOSIS — K769 Liver disease, unspecified: Secondary | ICD-10-CM | POA: Diagnosis not present

## 2023-12-31 DIAGNOSIS — R809 Proteinuria, unspecified: Secondary | ICD-10-CM | POA: Diagnosis not present

## 2023-12-31 DIAGNOSIS — R519 Headache, unspecified: Secondary | ICD-10-CM | POA: Diagnosis not present

## 2023-12-31 DIAGNOSIS — R000 Tachycardia, unspecified: Secondary | ICD-10-CM | POA: Diagnosis not present

## 2023-12-31 DIAGNOSIS — H5710 Ocular pain, unspecified eye: Secondary | ICD-10-CM | POA: Diagnosis not present

## 2023-12-31 DIAGNOSIS — R17 Unspecified jaundice: Secondary | ICD-10-CM | POA: Diagnosis not present

## 2023-12-31 DIAGNOSIS — R748 Abnormal levels of other serum enzymes: Secondary | ICD-10-CM | POA: Diagnosis not present

## 2023-12-31 DIAGNOSIS — C801 Malignant (primary) neoplasm, unspecified: Secondary | ICD-10-CM | POA: Diagnosis not present

## 2024-01-01 DIAGNOSIS — R509 Fever, unspecified: Secondary | ICD-10-CM | POA: Diagnosis not present

## 2024-01-01 DIAGNOSIS — C240 Malignant neoplasm of extrahepatic bile duct: Secondary | ICD-10-CM | POA: Diagnosis not present

## 2024-01-01 DIAGNOSIS — R0600 Dyspnea, unspecified: Secondary | ICD-10-CM | POA: Diagnosis not present

## 2024-01-01 DIAGNOSIS — C7889 Secondary malignant neoplasm of other digestive organs: Secondary | ICD-10-CM | POA: Diagnosis not present

## 2024-01-01 DIAGNOSIS — Z4659 Encounter for fitting and adjustment of other gastrointestinal appliance and device: Secondary | ICD-10-CM | POA: Diagnosis not present

## 2024-01-01 DIAGNOSIS — K831 Obstruction of bile duct: Secondary | ICD-10-CM | POA: Diagnosis not present

## 2024-01-01 DIAGNOSIS — R17 Unspecified jaundice: Secondary | ICD-10-CM | POA: Diagnosis not present

## 2024-01-01 DIAGNOSIS — C801 Malignant (primary) neoplasm, unspecified: Secondary | ICD-10-CM | POA: Diagnosis not present

## 2024-01-01 DIAGNOSIS — Z9689 Presence of other specified functional implants: Secondary | ICD-10-CM | POA: Diagnosis not present

## 2024-01-02 DIAGNOSIS — R0600 Dyspnea, unspecified: Secondary | ICD-10-CM | POA: Diagnosis not present

## 2024-01-02 DIAGNOSIS — R509 Fever, unspecified: Secondary | ICD-10-CM | POA: Diagnosis not present

## 2024-01-02 DIAGNOSIS — K831 Obstruction of bile duct: Secondary | ICD-10-CM | POA: Diagnosis not present

## 2024-01-02 DIAGNOSIS — C801 Malignant (primary) neoplasm, unspecified: Secondary | ICD-10-CM | POA: Diagnosis not present

## 2024-01-03 DIAGNOSIS — R0600 Dyspnea, unspecified: Secondary | ICD-10-CM | POA: Diagnosis not present

## 2024-01-03 DIAGNOSIS — K831 Obstruction of bile duct: Secondary | ICD-10-CM | POA: Diagnosis not present

## 2024-01-03 DIAGNOSIS — R509 Fever, unspecified: Secondary | ICD-10-CM | POA: Diagnosis not present

## 2024-01-03 DIAGNOSIS — C801 Malignant (primary) neoplasm, unspecified: Secondary | ICD-10-CM | POA: Diagnosis not present

## 2024-01-04 DIAGNOSIS — C801 Malignant (primary) neoplasm, unspecified: Secondary | ICD-10-CM | POA: Diagnosis not present

## 2024-01-04 DIAGNOSIS — R509 Fever, unspecified: Secondary | ICD-10-CM | POA: Diagnosis not present

## 2024-01-04 DIAGNOSIS — K831 Obstruction of bile duct: Secondary | ICD-10-CM | POA: Diagnosis not present

## 2024-01-04 DIAGNOSIS — R0600 Dyspnea, unspecified: Secondary | ICD-10-CM | POA: Diagnosis not present

## 2024-01-05 DIAGNOSIS — K831 Obstruction of bile duct: Secondary | ICD-10-CM | POA: Diagnosis not present

## 2024-01-05 DIAGNOSIS — R509 Fever, unspecified: Secondary | ICD-10-CM | POA: Diagnosis not present

## 2024-01-05 DIAGNOSIS — R7881 Bacteremia: Secondary | ICD-10-CM | POA: Diagnosis not present

## 2024-01-05 DIAGNOSIS — R0600 Dyspnea, unspecified: Secondary | ICD-10-CM | POA: Diagnosis not present

## 2024-01-05 DIAGNOSIS — C801 Malignant (primary) neoplasm, unspecified: Secondary | ICD-10-CM | POA: Diagnosis not present

## 2024-01-18 ENCOUNTER — Ambulatory Visit: Payer: 59 | Admitting: Cardiovascular Disease

## 2024-01-21 DIAGNOSIS — R5383 Other fatigue: Secondary | ICD-10-CM | POA: Diagnosis not present

## 2024-01-21 DIAGNOSIS — C240 Malignant neoplasm of extrahepatic bile duct: Secondary | ICD-10-CM | POA: Diagnosis not present

## 2024-01-21 DIAGNOSIS — Z0000 Encounter for general adult medical examination without abnormal findings: Secondary | ICD-10-CM | POA: Diagnosis not present

## 2024-01-30 DIAGNOSIS — C23 Malignant neoplasm of gallbladder: Secondary | ICD-10-CM | POA: Diagnosis not present

## 2024-01-30 DIAGNOSIS — K769 Liver disease, unspecified: Secondary | ICD-10-CM | POA: Diagnosis not present

## 2024-01-30 DIAGNOSIS — C786 Secondary malignant neoplasm of retroperitoneum and peritoneum: Secondary | ICD-10-CM | POA: Diagnosis not present

## 2024-01-31 DIAGNOSIS — Z792 Long term (current) use of antibiotics: Secondary | ICD-10-CM | POA: Diagnosis not present

## 2024-01-31 DIAGNOSIS — Z1331 Encounter for screening for depression: Secondary | ICD-10-CM | POA: Diagnosis not present

## 2024-01-31 DIAGNOSIS — K750 Abscess of liver: Secondary | ICD-10-CM | POA: Diagnosis not present

## 2024-03-09 DIAGNOSIS — Z8509 Personal history of malignant neoplasm of other digestive organs: Secondary | ICD-10-CM | POA: Diagnosis not present

## 2024-03-09 DIAGNOSIS — E782 Mixed hyperlipidemia: Secondary | ICD-10-CM | POA: Diagnosis not present

## 2024-03-09 DIAGNOSIS — Z87891 Personal history of nicotine dependence: Secondary | ICD-10-CM | POA: Diagnosis not present

## 2024-03-09 DIAGNOSIS — Z4659 Encounter for fitting and adjustment of other gastrointestinal appliance and device: Secondary | ICD-10-CM | POA: Diagnosis not present

## 2024-03-09 DIAGNOSIS — K831 Obstruction of bile duct: Secondary | ICD-10-CM | POA: Diagnosis not present

## 2024-03-09 DIAGNOSIS — K219 Gastro-esophageal reflux disease without esophagitis: Secondary | ICD-10-CM | POA: Diagnosis not present

## 2024-03-09 DIAGNOSIS — K838 Other specified diseases of biliary tract: Secondary | ICD-10-CM | POA: Diagnosis not present

## 2024-03-09 DIAGNOSIS — K311 Adult hypertrophic pyloric stenosis: Secondary | ICD-10-CM | POA: Diagnosis not present

## 2024-03-09 DIAGNOSIS — R160 Hepatomegaly, not elsewhere classified: Secondary | ICD-10-CM | POA: Diagnosis not present

## 2024-03-09 DIAGNOSIS — C23 Malignant neoplasm of gallbladder: Secondary | ICD-10-CM | POA: Diagnosis not present

## 2024-03-09 DIAGNOSIS — C801 Malignant (primary) neoplasm, unspecified: Secondary | ICD-10-CM | POA: Diagnosis not present

## 2024-03-09 DIAGNOSIS — C786 Secondary malignant neoplasm of retroperitoneum and peritoneum: Secondary | ICD-10-CM | POA: Diagnosis not present

## 2024-03-09 DIAGNOSIS — E876 Hypokalemia: Secondary | ICD-10-CM | POA: Diagnosis not present

## 2024-03-09 DIAGNOSIS — Z4682 Encounter for fitting and adjustment of non-vascular catheter: Secondary | ICD-10-CM | POA: Diagnosis not present

## 2024-03-09 DIAGNOSIS — G893 Neoplasm related pain (acute) (chronic): Secondary | ICD-10-CM | POA: Diagnosis not present

## 2024-03-10 DIAGNOSIS — K3189 Other diseases of stomach and duodenum: Secondary | ICD-10-CM | POA: Diagnosis not present

## 2024-03-10 DIAGNOSIS — Z4659 Encounter for fitting and adjustment of other gastrointestinal appliance and device: Secondary | ICD-10-CM | POA: Diagnosis not present

## 2024-03-10 DIAGNOSIS — C23 Malignant neoplasm of gallbladder: Secondary | ICD-10-CM | POA: Diagnosis not present

## 2024-03-10 DIAGNOSIS — K831 Obstruction of bile duct: Secondary | ICD-10-CM | POA: Diagnosis not present

## 2024-03-10 DIAGNOSIS — C801 Malignant (primary) neoplasm, unspecified: Secondary | ICD-10-CM | POA: Diagnosis not present

## 2024-03-10 DIAGNOSIS — K208 Other esophagitis without bleeding: Secondary | ICD-10-CM | POA: Diagnosis not present

## 2024-03-10 DIAGNOSIS — Z515 Encounter for palliative care: Secondary | ICD-10-CM | POA: Diagnosis not present

## 2024-03-10 DIAGNOSIS — C221 Intrahepatic bile duct carcinoma: Secondary | ICD-10-CM | POA: Diagnosis not present

## 2024-03-10 DIAGNOSIS — T182XXA Foreign body in stomach, initial encounter: Secondary | ICD-10-CM | POA: Diagnosis not present

## 2024-03-10 DIAGNOSIS — K315 Obstruction of duodenum: Secondary | ICD-10-CM | POA: Diagnosis not present

## 2024-03-10 DIAGNOSIS — K311 Adult hypertrophic pyloric stenosis: Secondary | ICD-10-CM | POA: Diagnosis not present

## 2024-03-11 DIAGNOSIS — K311 Adult hypertrophic pyloric stenosis: Secondary | ICD-10-CM | POA: Diagnosis not present

## 2024-03-11 DIAGNOSIS — K831 Obstruction of bile duct: Secondary | ICD-10-CM | POA: Diagnosis not present

## 2024-03-11 DIAGNOSIS — C801 Malignant (primary) neoplasm, unspecified: Secondary | ICD-10-CM | POA: Diagnosis not present

## 2024-03-12 ENCOUNTER — Encounter: Payer: Self-pay | Admitting: Emergency Medicine

## 2024-03-12 DIAGNOSIS — C799 Secondary malignant neoplasm of unspecified site: Secondary | ICD-10-CM

## 2024-03-13 ENCOUNTER — Other Ambulatory Visit: Payer: Self-pay | Admitting: Emergency Medicine

## 2024-03-13 DIAGNOSIS — C799 Secondary malignant neoplasm of unspecified site: Secondary | ICD-10-CM

## 2024-03-20 ENCOUNTER — Other Ambulatory Visit

## 2024-03-20 DIAGNOSIS — R109 Unspecified abdominal pain: Secondary | ICD-10-CM | POA: Diagnosis not present

## 2024-03-20 DIAGNOSIS — Z9689 Presence of other specified functional implants: Secondary | ICD-10-CM | POA: Diagnosis not present

## 2024-03-20 DIAGNOSIS — R1011 Right upper quadrant pain: Secondary | ICD-10-CM | POA: Diagnosis not present

## 2024-03-20 DIAGNOSIS — R1110 Vomiting, unspecified: Secondary | ICD-10-CM | POA: Diagnosis not present

## 2024-03-20 DIAGNOSIS — K838 Other specified diseases of biliary tract: Secondary | ICD-10-CM | POA: Diagnosis not present

## 2024-03-20 DIAGNOSIS — Z87891 Personal history of nicotine dependence: Secondary | ICD-10-CM | POA: Diagnosis not present

## 2024-03-20 DIAGNOSIS — R001 Bradycardia, unspecified: Secondary | ICD-10-CM | POA: Diagnosis not present

## 2024-03-20 DIAGNOSIS — R160 Hepatomegaly, not elsewhere classified: Secondary | ICD-10-CM | POA: Diagnosis not present

## 2024-03-20 DIAGNOSIS — R1013 Epigastric pain: Secondary | ICD-10-CM | POA: Diagnosis not present

## 2024-03-20 DIAGNOSIS — C221 Intrahepatic bile duct carcinoma: Secondary | ICD-10-CM | POA: Diagnosis not present

## 2024-03-20 DIAGNOSIS — R112 Nausea with vomiting, unspecified: Secondary | ICD-10-CM | POA: Diagnosis not present

## 2024-03-20 DIAGNOSIS — Z9889 Other specified postprocedural states: Secondary | ICD-10-CM | POA: Diagnosis not present

## 2024-03-20 DIAGNOSIS — K209 Esophagitis, unspecified without bleeding: Secondary | ICD-10-CM | POA: Diagnosis not present

## 2024-03-20 DIAGNOSIS — C786 Secondary malignant neoplasm of retroperitoneum and peritoneum: Secondary | ICD-10-CM | POA: Diagnosis not present

## 2024-03-20 DIAGNOSIS — C23 Malignant neoplasm of gallbladder: Secondary | ICD-10-CM | POA: Diagnosis not present

## 2024-03-25 ENCOUNTER — Other Ambulatory Visit: Payer: Self-pay | Admitting: Emergency Medicine

## 2024-03-25 DIAGNOSIS — C249 Malignant neoplasm of biliary tract, unspecified: Secondary | ICD-10-CM

## 2024-03-28 ENCOUNTER — Ambulatory Visit
Admission: RE | Admit: 2024-03-28 | Discharge: 2024-03-28 | Disposition: A | Discharge status: Home / Self Care | Source: Ambulatory Visit | Attending: Emergency Medicine | Admitting: Emergency Medicine

## 2024-03-28 DIAGNOSIS — R160 Hepatomegaly, not elsewhere classified: Secondary | ICD-10-CM | POA: Diagnosis not present

## 2024-03-28 DIAGNOSIS — E876 Hypokalemia: Secondary | ICD-10-CM | POA: Diagnosis not present

## 2024-03-28 DIAGNOSIS — C799 Secondary malignant neoplasm of unspecified site: Secondary | ICD-10-CM

## 2024-03-28 DIAGNOSIS — R109 Unspecified abdominal pain: Secondary | ICD-10-CM | POA: Diagnosis not present

## 2024-04-04 ENCOUNTER — Ambulatory Visit

## 2024-04-09 DIAGNOSIS — R1013 Epigastric pain: Secondary | ICD-10-CM | POA: Diagnosis not present

## 2024-04-09 DIAGNOSIS — Z8509 Personal history of malignant neoplasm of other digestive organs: Secondary | ICD-10-CM | POA: Diagnosis not present

## 2024-04-15 ENCOUNTER — Other Ambulatory Visit: Payer: Self-pay

## 2024-04-15 ENCOUNTER — Emergency Department

## 2024-04-15 ENCOUNTER — Emergency Department: Admission: EM | Admit: 2024-04-15 | Discharge: 2024-04-15 | Disposition: A | Discharge status: Home / Self Care

## 2024-04-15 DIAGNOSIS — I10 Essential (primary) hypertension: Secondary | ICD-10-CM | POA: Insufficient documentation

## 2024-04-15 DIAGNOSIS — R079 Chest pain, unspecified: Secondary | ICD-10-CM | POA: Diagnosis not present

## 2024-04-15 DIAGNOSIS — R531 Weakness: Secondary | ICD-10-CM | POA: Diagnosis not present

## 2024-04-15 DIAGNOSIS — I700 Atherosclerosis of aorta: Secondary | ICD-10-CM | POA: Diagnosis not present

## 2024-04-15 DIAGNOSIS — C221 Intrahepatic bile duct carcinoma: Secondary | ICD-10-CM | POA: Insufficient documentation

## 2024-04-15 DIAGNOSIS — C786 Secondary malignant neoplasm of retroperitoneum and peritoneum: Secondary | ICD-10-CM | POA: Diagnosis not present

## 2024-04-15 DIAGNOSIS — R0600 Dyspnea, unspecified: Secondary | ICD-10-CM | POA: Diagnosis not present

## 2024-04-15 DIAGNOSIS — R1010 Upper abdominal pain, unspecified: Secondary | ICD-10-CM | POA: Diagnosis not present

## 2024-04-15 DIAGNOSIS — K56601 Complete intestinal obstruction, unspecified as to cause: Secondary | ICD-10-CM | POA: Diagnosis not present

## 2024-04-15 DIAGNOSIS — C482 Malignant neoplasm of peritoneum, unspecified: Secondary | ICD-10-CM | POA: Insufficient documentation

## 2024-04-15 DIAGNOSIS — R0989 Other specified symptoms and signs involving the circulatory and respiratory systems: Secondary | ICD-10-CM | POA: Diagnosis not present

## 2024-04-15 DIAGNOSIS — R1084 Generalized abdominal pain: Secondary | ICD-10-CM | POA: Diagnosis not present

## 2024-04-15 DIAGNOSIS — K56609 Unspecified intestinal obstruction, unspecified as to partial versus complete obstruction: Secondary | ICD-10-CM

## 2024-04-15 DIAGNOSIS — R069 Unspecified abnormalities of breathing: Secondary | ICD-10-CM | POA: Diagnosis not present

## 2024-04-15 DIAGNOSIS — R112 Nausea with vomiting, unspecified: Secondary | ICD-10-CM | POA: Diagnosis not present

## 2024-04-15 DIAGNOSIS — R160 Hepatomegaly, not elsewhere classified: Secondary | ICD-10-CM | POA: Diagnosis not present

## 2024-04-15 LAB — CBC
HCT: 31.8 % — ABNORMAL LOW (ref 39.0–52.0)
Hemoglobin: 10.9 g/dL — ABNORMAL LOW (ref 13.0–17.0)
MCH: 33.3 pg (ref 26.0–34.0)
MCHC: 34.3 g/dL (ref 30.0–36.0)
MCV: 97.2 fL (ref 80.0–100.0)
Platelets: 378 K/uL (ref 150–400)
RBC: 3.27 MIL/uL — ABNORMAL LOW (ref 4.22–5.81)
RDW: 13.5 % (ref 11.5–15.5)
WBC: 8.8 K/uL (ref 4.0–10.5)
nRBC: 0 % (ref 0.0–0.2)

## 2024-04-15 LAB — COMPREHENSIVE METABOLIC PANEL WITH GFR
ALT: 63 U/L — ABNORMAL HIGH (ref 0–44)
AST: 57 U/L — ABNORMAL HIGH (ref 15–41)
Albumin: 3.5 g/dL (ref 3.5–5.0)
Alkaline Phosphatase: 316 U/L — ABNORMAL HIGH (ref 38–126)
Anion gap: 10 (ref 5–15)
BUN: 22 mg/dL (ref 8–23)
CO2: 25 mmol/L (ref 22–32)
Calcium: 8.9 mg/dL (ref 8.9–10.3)
Chloride: 101 mmol/L (ref 98–111)
Creatinine, Ser: 0.79 mg/dL (ref 0.61–1.24)
GFR, Estimated: >60 mL/min (ref >60)
Glucose, Bld: 112 mg/dL — ABNORMAL HIGH (ref 70–99)
Potassium: 3.2 mmol/L — ABNORMAL LOW (ref 3.5–5.1)
Sodium: 136 mmol/L (ref 135–145)
Total Bilirubin: 2.5 mg/dL — ABNORMAL HIGH (ref 0.0–1.2)
Total Protein: 7.4 g/dL (ref 6.5–8.1)

## 2024-04-15 LAB — TROPONIN I (HIGH SENSITIVITY): Troponin I (High Sensitivity): 15 ng/L (ref <18)

## 2024-04-15 LAB — LIPASE, BLOOD: Lipase: 33 U/L (ref 11–51)

## 2024-04-15 MED ORDER — HYDROMORPHONE HCL 1 MG/ML IJ SOLN
1.0000 mg | Freq: Once | INTRAMUSCULAR | Status: AC
  Administered 2024-04-15: 1 mg via INTRAVENOUS
  Filled 2024-04-15: qty 1

## 2024-04-15 MED ORDER — ONDANSETRON HCL 4 MG/2ML IJ SOLN
4.0000 mg | Freq: Once | INTRAMUSCULAR | Status: AC
  Administered 2024-04-15: 4 mg via INTRAVENOUS
  Filled 2024-04-15: qty 2

## 2024-04-15 MED ORDER — IOHEXOL 300 MG/ML  SOLN
100.0000 mL | Freq: Once | INTRAMUSCULAR | Status: AC | PRN
  Administered 2024-04-15: 100 mL via INTRAVENOUS

## 2024-04-15 MED ORDER — SODIUM CHLORIDE 0.9 % IV BOLUS
1000.0000 mL | Freq: Once | INTRAVENOUS | Status: AC
  Administered 2024-04-15: 1000 mL via INTRAVENOUS

## 2024-04-15 NOTE — ED Provider Notes (Signed)
 [facility] Provider Note    Event Date/Time   First MD Initiated Contact with Patient 04/15/24 1648     (approximate)   History   Emesis  First Nurse Note: Patient to ED via [facility] from home for weakness with SOB. Family concerned for dehydration. Hx gallbladder cancer  99% RA 160/100 18 rr 97.6 temp 70 HR 115 cbg  18 R forearm  Patient complaining of weakness, abdominal pain and vomiting for the past few days.    HPI Michael Sims is a 63 y.o. male PMH hypertension, hyperlipidemia, cholangiocarcinoma with biliary obstruction and gastric outlet obstruction status post duodenal stent, peritoneal carcinomatosis, presents to emergency department for nausea and vomiting, upper abdominal pain, vague chest discomfort -Patient prefers to use his daughter bedside for interpretation.  She is fluent in [place] and Arabic.  Declines formal interpreter. -States patient has been having intractable nausea and vomiting for about 3 days.  This seems to have improved notably after she took off his fentanyl patch, also did this because he was having constipation.  Did have a normal bowel movement today.  No black or bloody stools, no diarrhea. -No fevers -He is not on chemotherapy -Has complained of some vague chest discomfort as well, none currently.  Primarily complaining of abdominal pain which he notes is significantly better after a dose of Dilaudid , still tender to palpation.     Physical Exam   Triage Vital Signs: ED Triage Vitals  Encounter Vitals Group     BP 04/15/24 1616 (!) 142/108     Girls Systolic BP Percentile --      Girls Diastolic BP Percentile --      Boys Systolic BP Percentile --      Boys Diastolic BP Percentile --      Pulse Rate 04/15/24 1616 72     Resp 04/15/24 1616 20     Temp 04/15/24 1618 97.8 F (36.6 C)     Temp Source 04/15/24 1618 Oral     SpO2 04/15/24 1616 99 %     Weight --      Height --      Head Circumference --       Peak Flow --      Pain Score 04/15/24 1616 8     Pain Loc --      Pain Education --      Exclude from Growth Chart --     Most recent vital signs: Vitals:   04/15/24 2030 04/15/24 2147  BP: (!) 161/105 (!) 167/105  Pulse: 64 64  Resp: 18 16  Temp:    SpO2: 98% 96%     General: Awake, no distress.  CV:  Good peripheral perfusion. RRR, RP 2+ Resp:  Normal effort. CTAB Abd:  No distention.  Moderate tenderness palpation in epigastrium and bilateral upper quadrants, no tenderness in lower abdomen.  No CVA tenderness.    ED Results / Procedures / Treatments   Labs (all labs ordered are listed, but only abnormal results are displayed) Labs Reviewed  COMPREHENSIVE METABOLIC PANEL WITH GFR - Abnormal; Notable for the following components:      Result Value   Potassium 3.2 (*)    Glucose, Bld 112 (*)    AST 57 (*)    ALT 63 (*)    Alkaline Phosphatase 316 (*)    Total Bilirubin 2.5 (*)    All other components within normal limits  CBC - Abnormal; Notable for the following components:  RBC 3.27 (*)    Hemoglobin 10.9 (*)    HCT 31.8 (*)    All other components within normal limits  LIPASE, BLOOD  TROPONIN I (HIGH SENSITIVITY)     EKG  Ecg = sinus rhythm, rate 74, no gross ST elevation, possible trace isolated ST depression isolated to lead II, left axis deviation, normal intervals.  No significant repolarization abnormality appreciated.  Somewhat abnormal EKG but no evidence of STEMI.   RADIOLOGY Radiology interpreted by myself radiology report reviewed.  Concerning for partial/early bowel obstruction.    PROCEDURES:  Critical Care performed: No  Procedures   MEDICATIONS ORDERED IN ED: Medications  sodium chloride  0.9 % bolus 1,000 mL (0 mLs Intravenous Stopped 04/15/24 2011)  ondansetron  (ZOFRAN ) injection 4 mg (4 mg Intravenous Given 04/15/24 1759)  HYDROmorphone  (DILAUDID ) injection 1 mg (1 mg Intravenous Given 04/15/24 1801)  iohexol  (OMNIPAQUE ) 300  MG/ML solution 100 mL (100 mLs Intravenous Contrast Given 04/15/24 1829)     IMPRESSION / MDM / ASSESSMENT AND PLAN / ED COURSE  I reviewed the triage vital signs and the nursing notes.                              DDX/MDM/AP: Differential diagnosis includes, but is not limited to, recurrent gastric outlet obstruction, consider stent movement, recurrent biliary obstruction, SBO.  Appears patient's chest discomfort is primarily diaphragmatic, suspect secondary to upper abdominal pathology though will screen with troponin, EKG, chest x-ray.  Consider nausea and constipation secondary to fentanyl patch.  Plan: - Labs - CT abdomen pelvis - EKG - Chest x-ray - IV fluid, Zofran , Dilaudid  - Reassess  Patient's presentation is most consistent with acute presentation with potential threat to life or bodily function.  The patient is on the cardiac monitor to evaluate for evidence of arrhythmia and/or significant heart rate changes.  ED course below.  With partial/early bowel obstruction and worsening peritoneal carcinomatosis and apparent worsening cholangiocarcinoma.  Patient and family requested transfer to [facility] as this is where they have received all of their care.  Initiated transfer process.  Hospitalist service full, transfer center did speak with oncology service who stated no immediate need for transfer from their perspective though did recommend discussing with surgery service.  In the meantime, patient's daughter did become quite anxious and repeatedly asked about leaving the hospital.  I did tell her we are still in the midst of talking with the service as they requested but patient and his daughter decided to leave the hospital AGAINST MEDICAL ADVICE before further management and consultation.  They have decision-making capacity on my evaluation and understand risks of clinical decompensation including death by leaving the hospital at this time.  Clinical Course as of 04/16/24  0006  Tue Apr 15, 2024  1737 CBC with improved anemia   [MM]  1737 CMP with overall stable transaminitis, mild hypokalemia [MM]  1935 CTAP: IMPRESSION: 1. Likely developing partial or early proximal small-bowel obstruction in the setting of interval increase in size of a 3.1 x 4.2 cm serosal deposit along the fourth portion of the duodenum. 2. Interval worsening of peritoneal carcinomatosis. Associated worsening misty mesentery of the small bowel. 3. Interval worsening of an approximately 9 x 6 cm markedly ill-defined hypodense mass centered along the porta hepatis consistent with known cholangiocarcinoma. Mass appears to be inseparable with the first and second portion of the duodenum. Associated duodenal stent. 4. Ill-defined difficult to measure serosal  deposit along the terminal ileum/appendix region. 5. Trace volume simple free fluid ascites. 6.  Aortic Atherosclerosis (ICD10-I70.0).   [MM]  2013 Findings discussed with family  Prefer to be treated at [facility] if possible  ED secretary reaching out to [facility] transfer center [MM]  2035 Discussed with [facility] transfer center, they note the hospital medicine service is entirely full but are going to see if we can get a consultation Information provided Will call me back [MM]  2137 Spoke w/ W[facility] at [facility] w/ onc, no indication for transfer at this time but rec d/w gen surg to see if any further  [MM]    Clinical Course User Index [MM] Clarine Ozell LABOR, MD     FINAL CLINICAL IMPRESSION(S) / ED DIAGNOSES   Final diagnoses:  Cholangiocarcinoma (HCC)  Peritoneal carcinomatosis (HCC)  Intestinal obstruction, unspecified cause, unspecified whether partial or complete [facility])     Rx / DC Orders   ED Discharge Orders     None        Note:  This document was prepared using Dragon voice recognition software and may include unintentional dictation errors.   Clarine Ozell LABOR, MD 04/16/24 [phone]

## 2024-04-15 NOTE — ED Triage Notes (Signed)
 First Nurse Note: Patient to ED via [facility] from home for weakness with SOB. Family concerned for dehydration. Hx gallbladder cancer  99% RA 160/100 18 rr 97.6 temp 70 HR 115 cbg  18 R forearm

## 2024-04-15 NOTE — ED Triage Notes (Signed)
 Patient complaining of weakness, abdominal pain and vomiting for the past few days.

## 2024-04-15 NOTE — ED Notes (Signed)
 Called [facility] per Dr. Clarine spoke with [facility]  for transfer

## 2024-04-16 DIAGNOSIS — E785 Hyperlipidemia, unspecified: Secondary | ICD-10-CM | POA: Diagnosis not present

## 2024-04-16 DIAGNOSIS — Z79899 Other long term (current) drug therapy: Secondary | ICD-10-CM | POA: Diagnosis not present

## 2024-04-16 DIAGNOSIS — K315 Obstruction of duodenum: Secondary | ICD-10-CM | POA: Diagnosis not present

## 2024-04-16 DIAGNOSIS — C786 Secondary malignant neoplasm of retroperitoneum and peritoneum: Secondary | ICD-10-CM | POA: Diagnosis not present

## 2024-04-16 DIAGNOSIS — K56600 Partial intestinal obstruction, unspecified as to cause: Secondary | ICD-10-CM | POA: Diagnosis not present

## 2024-04-16 DIAGNOSIS — K2100 Gastro-esophageal reflux disease with esophagitis, without bleeding: Secondary | ICD-10-CM | POA: Diagnosis not present

## 2024-04-16 DIAGNOSIS — D539 Nutritional anemia, unspecified: Secondary | ICD-10-CM | POA: Diagnosis not present

## 2024-04-16 DIAGNOSIS — K56609 Unspecified intestinal obstruction, unspecified as to partial versus complete obstruction: Secondary | ICD-10-CM | POA: Diagnosis not present

## 2024-04-16 DIAGNOSIS — Z87891 Personal history of nicotine dependence: Secondary | ICD-10-CM | POA: Diagnosis not present

## 2024-04-16 DIAGNOSIS — K838 Other specified diseases of biliary tract: Secondary | ICD-10-CM | POA: Diagnosis not present

## 2024-04-16 DIAGNOSIS — R1084 Generalized abdominal pain: Secondary | ICD-10-CM | POA: Diagnosis not present

## 2024-04-16 DIAGNOSIS — T182XXA Foreign body in stomach, initial encounter: Secondary | ICD-10-CM | POA: Diagnosis not present

## 2024-04-16 DIAGNOSIS — C221 Intrahepatic bile duct carcinoma: Secondary | ICD-10-CM | POA: Diagnosis not present

## 2024-04-16 DIAGNOSIS — T85598A Other mechanical complication of other gastrointestinal prosthetic devices, implants and grafts, initial encounter: Secondary | ICD-10-CM | POA: Diagnosis not present

## 2024-04-16 DIAGNOSIS — Z4659 Encounter for fitting and adjustment of other gastrointestinal appliance and device: Secondary | ICD-10-CM | POA: Diagnosis not present

## 2024-04-16 DIAGNOSIS — G893 Neoplasm related pain (acute) (chronic): Secondary | ICD-10-CM | POA: Diagnosis not present

## 2024-04-16 DIAGNOSIS — K5669 Other partial intestinal obstruction: Secondary | ICD-10-CM | POA: Diagnosis not present

## 2024-04-16 DIAGNOSIS — I10 Essential (primary) hypertension: Secondary | ICD-10-CM | POA: Diagnosis not present

## 2024-04-16 DIAGNOSIS — K769 Liver disease, unspecified: Secondary | ICD-10-CM | POA: Diagnosis not present

## 2024-04-16 DIAGNOSIS — C23 Malignant neoplasm of gallbladder: Secondary | ICD-10-CM | POA: Diagnosis not present

## 2024-04-16 DIAGNOSIS — Z885 Allergy status to narcotic agent status: Secondary | ICD-10-CM | POA: Diagnosis not present

## 2024-04-16 NOTE — ED Provider Notes (Incomplete)
 [facility] Provider Note    Event Date/Time   First MD Initiated Contact with Patient 04/15/24 1648     (approximate)   History   Emesis  First Nurse Note: Patient to ED via [facility] from home for weakness with SOB. Family concerned for dehydration. Hx gallbladder cancer  99% RA 160/100 18 rr 97.6 temp 70 HR 115 cbg  18 R forearm  Patient complaining of weakness, abdominal pain and vomiting for the past few days.    HPI Michael Sims is a 63 y.o. male PMH hypertension, hyperlipidemia, cholangiocarcinoma with biliary obstruction and gastric outlet obstruction status post duodenal stent, peritoneal carcinomatosis, presents to emergency department for nausea and vomiting, upper abdominal pain, vague chest discomfort -Patient prefers to use his daughter bedside for interpretation.  She is fluent in [place] and Arabic.  Declines formal interpreter. -States patient has been having intractable nausea and vomiting for about 3 days.  This seems to have improved notably after she took off his fentanyl patch, also did this because he was having constipation.  Did have a normal bowel movement today.  No black or bloody stools, no diarrhea. -No fevers -He is not on chemotherapy -Has complained of some vague chest discomfort as well, none currently.  Primarily complaining of abdominal pain which he notes is significantly better after a dose of Dilaudid , still tender to palpation.     Physical Exam   Triage Vital Signs: ED Triage Vitals  Encounter Vitals Group     BP 04/15/24 1616 (!) 142/108     Girls Systolic BP Percentile --      Girls Diastolic BP Percentile --      Boys Systolic BP Percentile --      Boys Diastolic BP Percentile --      Pulse Rate 04/15/24 1616 72     Resp 04/15/24 1616 20     Temp 04/15/24 1618 97.8 F (36.6 C)     Temp Source 04/15/24 1618 Oral     SpO2 04/15/24 1616 99 %     Weight --      Height --      Head Circumference --       Peak Flow --      Pain Score 04/15/24 1616 8     Pain Loc --      Pain Education --      Exclude from Growth Chart --     Most recent vital signs: Vitals:   04/15/24 1616 04/15/24 1618  BP: (!) 142/108   Pulse: 72   Resp: 20   Temp:  97.8 F (36.6 C)  SpO2: 99%      General: Awake, no distress.  CV:  Good peripheral perfusion. RRR, RP 2+ Resp:  Normal effort. CTAB Abd:  No distention.  Moderate tenderness palpation in epigastrium and bilateral upper quadrants, no tenderness in lower abdomen.  No CVA tenderness.    ED Results / Procedures / Treatments   Labs (all labs ordered are listed, but only abnormal results are displayed) Labs Reviewed  COMPREHENSIVE METABOLIC PANEL WITH GFR - Abnormal; Notable for the following components:      Result Value   Potassium 3.2 (*)    Glucose, Bld 112 (*)    AST 57 (*)    ALT 63 (*)    Alkaline Phosphatase 316 (*)    Total Bilirubin 2.5 (*)    All other components within normal limits  CBC - Abnormal; Notable for the following  components:   RBC 3.27 (*)    Hemoglobin 10.9 (*)    HCT 31.8 (*)    All other components within normal limits  LIPASE, BLOOD  URINALYSIS, ROUTINE W REFLEX MICROSCOPIC     EKG  Ecg = sinus rhythm, rate 74, no gross ST elevation, possible trace isolated ST depression isolated to lead II, left axis deviation, normal intervals.  No significant repolarization abnormality appreciated.  Somewhat abnormal EKG but no evidence of STEMI.   RADIOLOGY Radiology interpreted by myself radiology report reviewed.  Concerning for partial/early bowel obstruction. {USE THE WORD INTERPRETED!! You MUST document your own interpretation of imaging, as well as the fact that you reviewed the radiologist's report!:1}   PROCEDURES:  Critical Care performed: No  Procedures   MEDICATIONS ORDERED IN ED: Medications - No data to display   IMPRESSION / MDM / ASSESSMENT AND PLAN / ED COURSE  I reviewed the triage  vital signs and the nursing notes.                              DDX/MDM/AP: Differential diagnosis includes, but is not limited to, recurrent gastric outlet obstruction, consider stent movement, recurrent biliary obstruction, SBO.  Appears patient's chest discomfort is primarily diaphragmatic, suspect secondary to upper abdominal pathology though will screen with troponin, EKG, chest x-ray.  Consider nausea and constipation secondary to fentanyl patch.  Plan: - Labs - CT abdomen pelvis - EKG - Chest x-ray - IV fluid, Zofran , Dilaudid  - Reassess  Patient's presentation is most consistent with acute presentation with potential threat to life or bodily function.  The patient is on the cardiac monitor to evaluate for evidence of arrhythmia and/or significant heart rate changes.  ED course below. ***  Clinical Course as of 04/16/24 0003  Tue Apr 15, 2024  1737 CBC with improved anemia   [MM]  1737 CMP with overall stable transaminitis, mild hypokalemia [MM]  1935 CTAP: IMPRESSION: 1. Likely developing partial or early proximal small-bowel obstruction in the setting of interval increase in size of a 3.1 x 4.2 cm serosal deposit along the fourth portion of the duodenum. 2. Interval worsening of peritoneal carcinomatosis. Associated worsening misty mesentery of the small bowel. 3. Interval worsening of an approximately 9 x 6 cm markedly ill-defined hypodense mass centered along the porta hepatis consistent with known cholangiocarcinoma. Mass appears to be inseparable with the first and second portion of the duodenum. Associated duodenal stent. 4. Ill-defined difficult to measure serosal deposit along the terminal ileum/appendix region. 5. Trace volume simple free fluid ascites. 6.  Aortic Atherosclerosis (ICD10-I70.0).   [MM]  2013 Findings discussed with family  Prefer to be treated at [facility] if possible  ED secretary reaching out to [facility] transfer center [MM]  2035 Discussed  with [facility] transfer center, they note the hospital medicine service is entirely full but are going to see if we can get a consultation Information provided Will call me back [MM]  2137 Spoke w/ Worthy at [facility] w/ onc, no indication for transfer at this time but rec d/w gen surg to see if any further  [MM]    Clinical Course User Index [MM] Clarine Ozell LABOR, MD     FINAL CLINICAL IMPRESSION(S) / ED DIAGNOSES   Final diagnoses:  None     Rx / DC Orders   ED Discharge Orders     None        Note:  This document was prepared using Dragon voice recognition software and may include unintentional dictation errors.

## 2024-04-28 ENCOUNTER — Ambulatory Visit

## 2024-05-08 DIAGNOSIS — K669 Disorder of peritoneum, unspecified: Secondary | ICD-10-CM | POA: Diagnosis not present

## 2024-05-08 DIAGNOSIS — R109 Unspecified abdominal pain: Secondary | ICD-10-CM | POA: Diagnosis not present

## 2024-05-08 DIAGNOSIS — K769 Liver disease, unspecified: Secondary | ICD-10-CM | POA: Diagnosis not present

## 2024-05-26 DIAGNOSIS — R55 Syncope and collapse: Secondary | ICD-10-CM | POA: Diagnosis not present

## 2024-05-26 DIAGNOSIS — K311 Adult hypertrophic pyloric stenosis: Secondary | ICD-10-CM | POA: Diagnosis not present

## 2024-05-26 DIAGNOSIS — C786 Secondary malignant neoplasm of retroperitoneum and peritoneum: Secondary | ICD-10-CM | POA: Diagnosis not present

## 2024-05-26 DIAGNOSIS — D62 Acute posthemorrhagic anemia: Secondary | ICD-10-CM | POA: Diagnosis not present

## 2024-05-26 DIAGNOSIS — R6889 Other general symptoms and signs: Secondary | ICD-10-CM | POA: Diagnosis not present

## 2024-05-26 DIAGNOSIS — K921 Melena: Secondary | ICD-10-CM | POA: Diagnosis not present

## 2024-05-26 DIAGNOSIS — Z87891 Personal history of nicotine dependence: Secondary | ICD-10-CM | POA: Diagnosis not present

## 2024-05-26 DIAGNOSIS — R188 Other ascites: Secondary | ICD-10-CM | POA: Diagnosis not present

## 2024-05-26 DIAGNOSIS — F1010 Alcohol abuse, uncomplicated: Secondary | ICD-10-CM | POA: Diagnosis not present

## 2024-05-26 DIAGNOSIS — C221 Intrahepatic bile duct carcinoma: Secondary | ICD-10-CM | POA: Diagnosis not present

## 2024-05-26 DIAGNOSIS — Y909 Presence of alcohol in blood, level not specified: Secondary | ICD-10-CM | POA: Diagnosis not present

## 2024-05-27 DIAGNOSIS — K311 Adult hypertrophic pyloric stenosis: Secondary | ICD-10-CM | POA: Diagnosis not present

## 2024-05-27 DIAGNOSIS — Z8509 Personal history of malignant neoplasm of other digestive organs: Secondary | ICD-10-CM | POA: Diagnosis not present

## 2024-05-28 DIAGNOSIS — T18128A Food in esophagus causing other injury, initial encounter: Secondary | ICD-10-CM | POA: Diagnosis not present

## 2024-05-28 DIAGNOSIS — T182XXA Foreign body in stomach, initial encounter: Secondary | ICD-10-CM | POA: Diagnosis not present

## 2024-05-28 DIAGNOSIS — C221 Intrahepatic bile duct carcinoma: Secondary | ICD-10-CM | POA: Diagnosis not present

## 2024-05-28 DIAGNOSIS — K921 Melena: Secondary | ICD-10-CM | POA: Diagnosis not present

## 2024-05-28 DIAGNOSIS — Z4659 Encounter for fitting and adjustment of other gastrointestinal appliance and device: Secondary | ICD-10-CM | POA: Diagnosis not present

## 2024-05-28 DIAGNOSIS — D62 Acute posthemorrhagic anemia: Secondary | ICD-10-CM | POA: Diagnosis not present

## 2024-05-28 DIAGNOSIS — Z8509 Personal history of malignant neoplasm of other digestive organs: Secondary | ICD-10-CM | POA: Diagnosis not present

## 2024-05-29 DIAGNOSIS — K311 Adult hypertrophic pyloric stenosis: Secondary | ICD-10-CM | POA: Diagnosis not present

## 2024-05-29 DIAGNOSIS — Z8509 Personal history of malignant neoplasm of other digestive organs: Secondary | ICD-10-CM | POA: Diagnosis not present

## 2024-05-30 DIAGNOSIS — K922 Gastrointestinal hemorrhage, unspecified: Secondary | ICD-10-CM | POA: Diagnosis not present

## 2024-05-30 DIAGNOSIS — R7401 Elevation of levels of liver transaminase levels: Secondary | ICD-10-CM | POA: Diagnosis not present

## 2024-05-30 DIAGNOSIS — C23 Malignant neoplasm of gallbladder: Secondary | ICD-10-CM | POA: Diagnosis not present

## 2024-05-30 DIAGNOSIS — K311 Adult hypertrophic pyloric stenosis: Secondary | ICD-10-CM | POA: Diagnosis not present

## 2024-05-30 DIAGNOSIS — Z4659 Encounter for fitting and adjustment of other gastrointestinal appliance and device: Secondary | ICD-10-CM | POA: Diagnosis not present

## 2024-05-30 DIAGNOSIS — K315 Obstruction of duodenum: Secondary | ICD-10-CM | POA: Diagnosis not present

## 2024-05-30 DIAGNOSIS — G893 Neoplasm related pain (acute) (chronic): Secondary | ICD-10-CM | POA: Diagnosis not present

## 2024-05-30 DIAGNOSIS — C786 Secondary malignant neoplasm of retroperitoneum and peritoneum: Secondary | ICD-10-CM | POA: Diagnosis not present

## 2024-05-30 DIAGNOSIS — T402X5A Adverse effect of other opioids, initial encounter: Secondary | ICD-10-CM | POA: Diagnosis not present

## 2024-05-30 DIAGNOSIS — K5903 Drug induced constipation: Secondary | ICD-10-CM | POA: Diagnosis not present

## 2024-05-31 DIAGNOSIS — R7401 Elevation of levels of liver transaminase levels: Secondary | ICD-10-CM | POA: Diagnosis not present

## 2024-05-31 DIAGNOSIS — T402X5A Adverse effect of other opioids, initial encounter: Secondary | ICD-10-CM | POA: Diagnosis not present

## 2024-05-31 DIAGNOSIS — C786 Secondary malignant neoplasm of retroperitoneum and peritoneum: Secondary | ICD-10-CM | POA: Diagnosis not present

## 2024-05-31 DIAGNOSIS — C23 Malignant neoplasm of gallbladder: Secondary | ICD-10-CM | POA: Diagnosis not present

## 2024-05-31 DIAGNOSIS — K922 Gastrointestinal hemorrhage, unspecified: Secondary | ICD-10-CM | POA: Diagnosis not present

## 2024-05-31 DIAGNOSIS — K5903 Drug induced constipation: Secondary | ICD-10-CM | POA: Diagnosis not present

## 2024-05-31 DIAGNOSIS — G893 Neoplasm related pain (acute) (chronic): Secondary | ICD-10-CM | POA: Diagnosis not present

## 2024-06-01 DIAGNOSIS — K922 Gastrointestinal hemorrhage, unspecified: Secondary | ICD-10-CM | POA: Diagnosis not present

## 2024-06-01 DIAGNOSIS — K311 Adult hypertrophic pyloric stenosis: Secondary | ICD-10-CM | POA: Diagnosis not present

## 2024-06-01 DIAGNOSIS — R7401 Elevation of levels of liver transaminase levels: Secondary | ICD-10-CM | POA: Diagnosis not present

## 2024-06-01 DIAGNOSIS — C23 Malignant neoplasm of gallbladder: Secondary | ICD-10-CM | POA: Diagnosis not present

## 2024-06-01 DIAGNOSIS — C786 Secondary malignant neoplasm of retroperitoneum and peritoneum: Secondary | ICD-10-CM | POA: Diagnosis not present

## 2024-06-01 DIAGNOSIS — T402X5A Adverse effect of other opioids, initial encounter: Secondary | ICD-10-CM | POA: Diagnosis not present

## 2024-06-01 DIAGNOSIS — G893 Neoplasm related pain (acute) (chronic): Secondary | ICD-10-CM | POA: Diagnosis not present

## 2024-06-01 DIAGNOSIS — K5903 Drug induced constipation: Secondary | ICD-10-CM | POA: Diagnosis not present

## 2024-06-02 DIAGNOSIS — G893 Neoplasm related pain (acute) (chronic): Secondary | ICD-10-CM | POA: Diagnosis not present

## 2024-06-02 DIAGNOSIS — K311 Adult hypertrophic pyloric stenosis: Secondary | ICD-10-CM | POA: Diagnosis not present

## 2024-06-02 DIAGNOSIS — T85898A Other specified complication of other internal prosthetic devices, implants and grafts, initial encounter: Secondary | ICD-10-CM | POA: Diagnosis not present

## 2024-06-03 DIAGNOSIS — Z8509 Personal history of malignant neoplasm of other digestive organs: Secondary | ICD-10-CM | POA: Diagnosis not present

## 2024-06-03 DIAGNOSIS — K311 Adult hypertrophic pyloric stenosis: Secondary | ICD-10-CM | POA: Diagnosis not present

## 2024-06-04 DIAGNOSIS — K311 Adult hypertrophic pyloric stenosis: Secondary | ICD-10-CM | POA: Diagnosis not present

## 2024-06-04 DIAGNOSIS — Z8509 Personal history of malignant neoplasm of other digestive organs: Secondary | ICD-10-CM | POA: Diagnosis not present

## 2024-06-05 DIAGNOSIS — Z8509 Personal history of malignant neoplasm of other digestive organs: Secondary | ICD-10-CM | POA: Diagnosis not present

## 2024-06-05 DIAGNOSIS — C801 Malignant (primary) neoplasm, unspecified: Secondary | ICD-10-CM | POA: Diagnosis not present

## 2024-06-06 DIAGNOSIS — Z8509 Personal history of malignant neoplasm of other digestive organs: Secondary | ICD-10-CM | POA: Diagnosis not present

## 2024-06-07 DIAGNOSIS — Z8509 Personal history of malignant neoplasm of other digestive organs: Secondary | ICD-10-CM | POA: Diagnosis not present

## 2024-06-11 DIAGNOSIS — C23 Malignant neoplasm of gallbladder: Secondary | ICD-10-CM | POA: Diagnosis not present

## 2024-06-11 DIAGNOSIS — K831 Obstruction of bile duct: Secondary | ICD-10-CM | POA: Diagnosis not present

## 2024-06-11 DIAGNOSIS — C801 Malignant (primary) neoplasm, unspecified: Secondary | ICD-10-CM | POA: Diagnosis not present

## 2024-06-11 DIAGNOSIS — C786 Secondary malignant neoplasm of retroperitoneum and peritoneum: Secondary | ICD-10-CM | POA: Diagnosis not present

## 2024-06-13 DIAGNOSIS — T402X5D Adverse effect of other opioids, subsequent encounter: Secondary | ICD-10-CM | POA: Diagnosis not present

## 2024-06-13 DIAGNOSIS — K311 Adult hypertrophic pyloric stenosis: Secondary | ICD-10-CM | POA: Diagnosis not present

## 2024-06-13 DIAGNOSIS — E43 Unspecified severe protein-calorie malnutrition: Secondary | ICD-10-CM | POA: Diagnosis not present

## 2024-06-13 DIAGNOSIS — R188 Other ascites: Secondary | ICD-10-CM | POA: Diagnosis not present

## 2024-06-13 DIAGNOSIS — K921 Melena: Secondary | ICD-10-CM | POA: Diagnosis not present

## 2024-06-13 DIAGNOSIS — K831 Obstruction of bile duct: Secondary | ICD-10-CM | POA: Diagnosis not present

## 2024-06-13 DIAGNOSIS — K5903 Drug induced constipation: Secondary | ICD-10-CM | POA: Diagnosis not present

## 2024-06-13 DIAGNOSIS — C786 Secondary malignant neoplasm of retroperitoneum and peritoneum: Secondary | ICD-10-CM | POA: Diagnosis not present

## 2024-06-13 DIAGNOSIS — I10 Essential (primary) hypertension: Secondary | ICD-10-CM | POA: Diagnosis not present

## 2024-06-13 DIAGNOSIS — F1010 Alcohol abuse, uncomplicated: Secondary | ICD-10-CM | POA: Diagnosis not present

## 2024-06-13 DIAGNOSIS — D649 Anemia, unspecified: Secondary | ICD-10-CM | POA: Diagnosis not present

## 2024-06-13 DIAGNOSIS — G893 Neoplasm related pain (acute) (chronic): Secondary | ICD-10-CM | POA: Diagnosis not present

## 2024-06-13 DIAGNOSIS — K2100 Gastro-esophageal reflux disease with esophagitis, without bleeding: Secondary | ICD-10-CM | POA: Diagnosis not present

## 2024-06-13 DIAGNOSIS — E785 Hyperlipidemia, unspecified: Secondary | ICD-10-CM | POA: Diagnosis not present

## 2024-06-13 DIAGNOSIS — Z4803 Encounter for change or removal of drains: Secondary | ICD-10-CM | POA: Diagnosis not present

## 2024-06-13 DIAGNOSIS — E876 Hypokalemia: Secondary | ICD-10-CM | POA: Diagnosis not present

## 2024-06-13 DIAGNOSIS — C23 Malignant neoplasm of gallbladder: Secondary | ICD-10-CM | POA: Diagnosis not present

## 2024-06-17 DIAGNOSIS — K921 Melena: Secondary | ICD-10-CM | POA: Diagnosis not present

## 2024-06-17 DIAGNOSIS — E785 Hyperlipidemia, unspecified: Secondary | ICD-10-CM | POA: Diagnosis not present

## 2024-06-17 DIAGNOSIS — T402X5D Adverse effect of other opioids, subsequent encounter: Secondary | ICD-10-CM | POA: Diagnosis not present

## 2024-06-17 DIAGNOSIS — D649 Anemia, unspecified: Secondary | ICD-10-CM | POA: Diagnosis not present

## 2024-06-17 DIAGNOSIS — K311 Adult hypertrophic pyloric stenosis: Secondary | ICD-10-CM | POA: Diagnosis not present

## 2024-06-17 DIAGNOSIS — C786 Secondary malignant neoplasm of retroperitoneum and peritoneum: Secondary | ICD-10-CM | POA: Diagnosis not present

## 2024-06-17 DIAGNOSIS — K831 Obstruction of bile duct: Secondary | ICD-10-CM | POA: Diagnosis not present

## 2024-06-17 DIAGNOSIS — K5903 Drug induced constipation: Secondary | ICD-10-CM | POA: Diagnosis not present

## 2024-06-17 DIAGNOSIS — F1010 Alcohol abuse, uncomplicated: Secondary | ICD-10-CM | POA: Diagnosis not present

## 2024-06-17 DIAGNOSIS — K2100 Gastro-esophageal reflux disease with esophagitis, without bleeding: Secondary | ICD-10-CM | POA: Diagnosis not present

## 2024-06-17 DIAGNOSIS — C23 Malignant neoplasm of gallbladder: Secondary | ICD-10-CM | POA: Diagnosis not present

## 2024-06-17 DIAGNOSIS — E43 Unspecified severe protein-calorie malnutrition: Secondary | ICD-10-CM | POA: Diagnosis not present

## 2024-06-17 DIAGNOSIS — R188 Other ascites: Secondary | ICD-10-CM | POA: Diagnosis not present

## 2024-06-17 DIAGNOSIS — I10 Essential (primary) hypertension: Secondary | ICD-10-CM | POA: Diagnosis not present

## 2024-06-17 DIAGNOSIS — G893 Neoplasm related pain (acute) (chronic): Secondary | ICD-10-CM | POA: Diagnosis not present

## 2024-06-17 DIAGNOSIS — E876 Hypokalemia: Secondary | ICD-10-CM | POA: Diagnosis not present

## 2024-06-17 DIAGNOSIS — Z4803 Encounter for change or removal of drains: Secondary | ICD-10-CM | POA: Diagnosis not present

## 2024-06-23 DIAGNOSIS — R188 Other ascites: Secondary | ICD-10-CM | POA: Diagnosis not present

## 2024-06-23 DIAGNOSIS — K831 Obstruction of bile duct: Secondary | ICD-10-CM | POA: Diagnosis not present

## 2024-06-23 DIAGNOSIS — C801 Malignant (primary) neoplasm, unspecified: Secondary | ICD-10-CM | POA: Diagnosis not present

## 2024-06-24 DIAGNOSIS — D649 Anemia, unspecified: Secondary | ICD-10-CM | POA: Diagnosis not present

## 2024-06-24 DIAGNOSIS — K921 Melena: Secondary | ICD-10-CM | POA: Diagnosis not present

## 2024-06-24 DIAGNOSIS — K311 Adult hypertrophic pyloric stenosis: Secondary | ICD-10-CM | POA: Diagnosis not present

## 2024-06-24 DIAGNOSIS — E785 Hyperlipidemia, unspecified: Secondary | ICD-10-CM | POA: Diagnosis not present

## 2024-06-24 DIAGNOSIS — Z4803 Encounter for change or removal of drains: Secondary | ICD-10-CM | POA: Diagnosis not present

## 2024-06-24 DIAGNOSIS — I10 Essential (primary) hypertension: Secondary | ICD-10-CM | POA: Diagnosis not present

## 2024-06-24 DIAGNOSIS — K5903 Drug induced constipation: Secondary | ICD-10-CM | POA: Diagnosis not present

## 2024-06-24 DIAGNOSIS — F1010 Alcohol abuse, uncomplicated: Secondary | ICD-10-CM | POA: Diagnosis not present

## 2024-06-24 DIAGNOSIS — C786 Secondary malignant neoplasm of retroperitoneum and peritoneum: Secondary | ICD-10-CM | POA: Diagnosis not present

## 2024-06-24 DIAGNOSIS — K831 Obstruction of bile duct: Secondary | ICD-10-CM | POA: Diagnosis not present

## 2024-06-24 DIAGNOSIS — R188 Other ascites: Secondary | ICD-10-CM | POA: Diagnosis not present

## 2024-06-24 DIAGNOSIS — G893 Neoplasm related pain (acute) (chronic): Secondary | ICD-10-CM | POA: Diagnosis not present

## 2024-06-24 DIAGNOSIS — C23 Malignant neoplasm of gallbladder: Secondary | ICD-10-CM | POA: Diagnosis not present

## 2024-06-24 DIAGNOSIS — K2100 Gastro-esophageal reflux disease with esophagitis, without bleeding: Secondary | ICD-10-CM | POA: Diagnosis not present

## 2024-06-24 DIAGNOSIS — E43 Unspecified severe protein-calorie malnutrition: Secondary | ICD-10-CM | POA: Diagnosis not present

## 2024-06-24 DIAGNOSIS — E876 Hypokalemia: Secondary | ICD-10-CM | POA: Diagnosis not present

## 2024-06-24 DIAGNOSIS — T402X5D Adverse effect of other opioids, subsequent encounter: Secondary | ICD-10-CM | POA: Diagnosis not present

## 2024-06-26 DIAGNOSIS — K831 Obstruction of bile duct: Secondary | ICD-10-CM | POA: Diagnosis not present

## 2024-06-26 DIAGNOSIS — E876 Hypokalemia: Secondary | ICD-10-CM | POA: Diagnosis not present

## 2024-06-26 DIAGNOSIS — Z4803 Encounter for change or removal of drains: Secondary | ICD-10-CM | POA: Diagnosis not present

## 2024-06-26 DIAGNOSIS — F1010 Alcohol abuse, uncomplicated: Secondary | ICD-10-CM | POA: Diagnosis not present

## 2024-06-26 DIAGNOSIS — T402X5D Adverse effect of other opioids, subsequent encounter: Secondary | ICD-10-CM | POA: Diagnosis not present

## 2024-06-26 DIAGNOSIS — K311 Adult hypertrophic pyloric stenosis: Secondary | ICD-10-CM | POA: Diagnosis not present

## 2024-06-26 DIAGNOSIS — R188 Other ascites: Secondary | ICD-10-CM | POA: Diagnosis not present

## 2024-06-26 DIAGNOSIS — K921 Melena: Secondary | ICD-10-CM | POA: Diagnosis not present

## 2024-06-26 DIAGNOSIS — G893 Neoplasm related pain (acute) (chronic): Secondary | ICD-10-CM | POA: Diagnosis not present

## 2024-06-26 DIAGNOSIS — E785 Hyperlipidemia, unspecified: Secondary | ICD-10-CM | POA: Diagnosis not present

## 2024-06-26 DIAGNOSIS — K5903 Drug induced constipation: Secondary | ICD-10-CM | POA: Diagnosis not present

## 2024-06-26 DIAGNOSIS — D649 Anemia, unspecified: Secondary | ICD-10-CM | POA: Diagnosis not present

## 2024-06-26 DIAGNOSIS — C23 Malignant neoplasm of gallbladder: Secondary | ICD-10-CM | POA: Diagnosis not present

## 2024-06-26 DIAGNOSIS — I10 Essential (primary) hypertension: Secondary | ICD-10-CM | POA: Diagnosis not present

## 2024-06-26 DIAGNOSIS — K2100 Gastro-esophageal reflux disease with esophagitis, without bleeding: Secondary | ICD-10-CM | POA: Diagnosis not present

## 2024-06-26 DIAGNOSIS — E43 Unspecified severe protein-calorie malnutrition: Secondary | ICD-10-CM | POA: Diagnosis not present

## 2024-06-26 DIAGNOSIS — C786 Secondary malignant neoplasm of retroperitoneum and peritoneum: Secondary | ICD-10-CM | POA: Diagnosis not present

## 2024-06-27 DIAGNOSIS — C23 Malignant neoplasm of gallbladder: Secondary | ICD-10-CM | POA: Diagnosis not present

## 2024-06-27 DIAGNOSIS — E876 Hypokalemia: Secondary | ICD-10-CM | POA: Diagnosis not present

## 2024-06-27 DIAGNOSIS — K921 Melena: Secondary | ICD-10-CM | POA: Diagnosis not present

## 2024-06-27 DIAGNOSIS — K311 Adult hypertrophic pyloric stenosis: Secondary | ICD-10-CM | POA: Diagnosis not present

## 2024-06-27 DIAGNOSIS — G893 Neoplasm related pain (acute) (chronic): Secondary | ICD-10-CM | POA: Diagnosis not present

## 2024-06-27 DIAGNOSIS — I10 Essential (primary) hypertension: Secondary | ICD-10-CM | POA: Diagnosis not present

## 2024-06-27 DIAGNOSIS — Z4803 Encounter for change or removal of drains: Secondary | ICD-10-CM | POA: Diagnosis not present

## 2024-06-27 DIAGNOSIS — E43 Unspecified severe protein-calorie malnutrition: Secondary | ICD-10-CM | POA: Diagnosis not present

## 2024-06-27 DIAGNOSIS — K831 Obstruction of bile duct: Secondary | ICD-10-CM | POA: Diagnosis not present

## 2024-06-27 DIAGNOSIS — K2100 Gastro-esophageal reflux disease with esophagitis, without bleeding: Secondary | ICD-10-CM | POA: Diagnosis not present

## 2024-06-27 DIAGNOSIS — K5903 Drug induced constipation: Secondary | ICD-10-CM | POA: Diagnosis not present

## 2024-06-27 DIAGNOSIS — C786 Secondary malignant neoplasm of retroperitoneum and peritoneum: Secondary | ICD-10-CM | POA: Diagnosis not present

## 2024-06-27 DIAGNOSIS — R188 Other ascites: Secondary | ICD-10-CM | POA: Diagnosis not present

## 2024-06-27 DIAGNOSIS — F1010 Alcohol abuse, uncomplicated: Secondary | ICD-10-CM | POA: Diagnosis not present

## 2024-06-27 DIAGNOSIS — T402X5D Adverse effect of other opioids, subsequent encounter: Secondary | ICD-10-CM | POA: Diagnosis not present

## 2024-06-27 DIAGNOSIS — D649 Anemia, unspecified: Secondary | ICD-10-CM | POA: Diagnosis not present

## 2024-06-27 DIAGNOSIS — E785 Hyperlipidemia, unspecified: Secondary | ICD-10-CM | POA: Diagnosis not present

## 2024-06-30 DIAGNOSIS — F1010 Alcohol abuse, uncomplicated: Secondary | ICD-10-CM | POA: Diagnosis not present

## 2024-06-30 DIAGNOSIS — K5903 Drug induced constipation: Secondary | ICD-10-CM | POA: Diagnosis not present

## 2024-06-30 DIAGNOSIS — Z4803 Encounter for change or removal of drains: Secondary | ICD-10-CM | POA: Diagnosis not present

## 2024-06-30 DIAGNOSIS — E43 Unspecified severe protein-calorie malnutrition: Secondary | ICD-10-CM | POA: Diagnosis not present

## 2024-06-30 DIAGNOSIS — K2100 Gastro-esophageal reflux disease with esophagitis, without bleeding: Secondary | ICD-10-CM | POA: Diagnosis not present

## 2024-06-30 DIAGNOSIS — G893 Neoplasm related pain (acute) (chronic): Secondary | ICD-10-CM | POA: Diagnosis not present

## 2024-06-30 DIAGNOSIS — E785 Hyperlipidemia, unspecified: Secondary | ICD-10-CM | POA: Diagnosis not present

## 2024-06-30 DIAGNOSIS — D649 Anemia, unspecified: Secondary | ICD-10-CM | POA: Diagnosis not present

## 2024-06-30 DIAGNOSIS — R188 Other ascites: Secondary | ICD-10-CM | POA: Diagnosis not present

## 2024-06-30 DIAGNOSIS — K311 Adult hypertrophic pyloric stenosis: Secondary | ICD-10-CM | POA: Diagnosis not present

## 2024-06-30 DIAGNOSIS — C23 Malignant neoplasm of gallbladder: Secondary | ICD-10-CM | POA: Diagnosis not present

## 2024-06-30 DIAGNOSIS — C786 Secondary malignant neoplasm of retroperitoneum and peritoneum: Secondary | ICD-10-CM | POA: Diagnosis not present

## 2024-06-30 DIAGNOSIS — T402X5D Adverse effect of other opioids, subsequent encounter: Secondary | ICD-10-CM | POA: Diagnosis not present

## 2024-06-30 DIAGNOSIS — I10 Essential (primary) hypertension: Secondary | ICD-10-CM | POA: Diagnosis not present

## 2024-06-30 DIAGNOSIS — E876 Hypokalemia: Secondary | ICD-10-CM | POA: Diagnosis not present

## 2024-06-30 DIAGNOSIS — K921 Melena: Secondary | ICD-10-CM | POA: Diagnosis not present

## 2024-06-30 DIAGNOSIS — A311 Cutaneous mycobacterial infection: Secondary | ICD-10-CM | POA: Diagnosis not present

## 2024-06-30 DIAGNOSIS — K831 Obstruction of bile duct: Secondary | ICD-10-CM | POA: Diagnosis not present

## 2024-07-01 DIAGNOSIS — Z87891 Personal history of nicotine dependence: Secondary | ICD-10-CM | POA: Diagnosis not present

## 2024-07-01 DIAGNOSIS — C786 Secondary malignant neoplasm of retroperitoneum and peritoneum: Secondary | ICD-10-CM | POA: Diagnosis not present

## 2024-07-01 DIAGNOSIS — K769 Liver disease, unspecified: Secondary | ICD-10-CM | POA: Diagnosis not present

## 2024-07-01 DIAGNOSIS — T85628A Displacement of other specified internal prosthetic devices, implants and grafts, initial encounter: Secondary | ICD-10-CM | POA: Diagnosis not present

## 2024-07-01 DIAGNOSIS — Z79891 Long term (current) use of opiate analgesic: Secondary | ICD-10-CM | POA: Diagnosis not present

## 2024-07-01 DIAGNOSIS — C801 Malignant (primary) neoplasm, unspecified: Secondary | ICD-10-CM | POA: Diagnosis not present

## 2024-07-01 DIAGNOSIS — E861 Hypovolemia: Secondary | ICD-10-CM | POA: Diagnosis not present

## 2024-07-01 DIAGNOSIS — A498 Other bacterial infections of unspecified site: Secondary | ICD-10-CM | POA: Diagnosis not present

## 2024-07-01 DIAGNOSIS — R188 Other ascites: Secondary | ICD-10-CM | POA: Diagnosis not present

## 2024-07-01 DIAGNOSIS — M5126 Other intervertebral disc displacement, lumbar region: Secondary | ICD-10-CM | POA: Diagnosis not present

## 2024-07-01 DIAGNOSIS — Z6820 Body mass index (BMI) 20.0-20.9, adult: Secondary | ICD-10-CM | POA: Diagnosis not present

## 2024-07-01 DIAGNOSIS — E860 Dehydration: Secondary | ICD-10-CM | POA: Diagnosis not present

## 2024-07-01 DIAGNOSIS — M9933 Osseous stenosis of neural canal of lumbar region: Secondary | ICD-10-CM | POA: Diagnosis not present

## 2024-07-01 DIAGNOSIS — Z885 Allergy status to narcotic agent status: Secondary | ICD-10-CM | POA: Diagnosis not present

## 2024-07-01 DIAGNOSIS — N179 Acute kidney failure, unspecified: Secondary | ICD-10-CM | POA: Diagnosis not present

## 2024-07-01 DIAGNOSIS — M47816 Spondylosis without myelopathy or radiculopathy, lumbar region: Secondary | ICD-10-CM | POA: Diagnosis not present

## 2024-07-01 DIAGNOSIS — C23 Malignant neoplasm of gallbladder: Secondary | ICD-10-CM | POA: Diagnosis not present

## 2024-07-01 DIAGNOSIS — C787 Secondary malignant neoplasm of liver and intrahepatic bile duct: Secondary | ICD-10-CM | POA: Diagnosis not present

## 2024-07-01 DIAGNOSIS — G893 Neoplasm related pain (acute) (chronic): Secondary | ICD-10-CM | POA: Diagnosis not present

## 2024-07-01 DIAGNOSIS — B965 Pseudomonas (aeruginosa) (mallei) (pseudomallei) as the cause of diseases classified elsewhere: Secondary | ICD-10-CM | POA: Diagnosis not present

## 2024-07-01 DIAGNOSIS — E871 Hypo-osmolality and hyponatremia: Secondary | ICD-10-CM | POA: Diagnosis not present

## 2024-07-01 DIAGNOSIS — Z9689 Presence of other specified functional implants: Secondary | ICD-10-CM | POA: Diagnosis not present

## 2024-07-01 DIAGNOSIS — I10 Essential (primary) hypertension: Secondary | ICD-10-CM | POA: Diagnosis not present

## 2024-07-01 DIAGNOSIS — E785 Hyperlipidemia, unspecified: Secondary | ICD-10-CM | POA: Diagnosis not present

## 2024-07-01 DIAGNOSIS — R180 Malignant ascites: Secondary | ICD-10-CM | POA: Diagnosis not present

## 2024-07-01 DIAGNOSIS — R7881 Bacteremia: Secondary | ICD-10-CM | POA: Diagnosis not present

## 2024-07-01 DIAGNOSIS — M48061 Spinal stenosis, lumbar region without neurogenic claudication: Secondary | ICD-10-CM | POA: Diagnosis not present

## 2024-07-01 DIAGNOSIS — E43 Unspecified severe protein-calorie malnutrition: Secondary | ICD-10-CM | POA: Diagnosis not present

## 2024-07-01 DIAGNOSIS — Z515 Encounter for palliative care: Secondary | ICD-10-CM | POA: Diagnosis not present

## 2024-07-01 DIAGNOSIS — K315 Obstruction of duodenum: Secondary | ICD-10-CM | POA: Diagnosis not present

## 2024-07-01 DIAGNOSIS — E872 Acidosis, unspecified: Secondary | ICD-10-CM | POA: Diagnosis not present

## 2024-07-01 DIAGNOSIS — K8309 Other cholangitis: Secondary | ICD-10-CM | POA: Diagnosis not present

## 2024-07-01 DIAGNOSIS — D500 Iron deficiency anemia secondary to blood loss (chronic): Secondary | ICD-10-CM | POA: Diagnosis not present

## 2024-07-01 DIAGNOSIS — B961 Klebsiella pneumoniae [K. pneumoniae] as the cause of diseases classified elsewhere: Secondary | ICD-10-CM | POA: Diagnosis not present

## 2024-07-01 DIAGNOSIS — K831 Obstruction of bile duct: Secondary | ICD-10-CM | POA: Diagnosis not present

## 2024-07-01 DIAGNOSIS — K219 Gastro-esophageal reflux disease without esophagitis: Secondary | ICD-10-CM | POA: Diagnosis not present

## 2024-07-01 DIAGNOSIS — K838 Other specified diseases of biliary tract: Secondary | ICD-10-CM | POA: Diagnosis not present

## 2024-07-02 DIAGNOSIS — K315 Obstruction of duodenum: Secondary | ICD-10-CM | POA: Diagnosis not present

## 2024-07-02 DIAGNOSIS — R7881 Bacteremia: Secondary | ICD-10-CM | POA: Diagnosis not present

## 2024-07-02 DIAGNOSIS — C23 Malignant neoplasm of gallbladder: Secondary | ICD-10-CM | POA: Diagnosis not present

## 2024-07-02 DIAGNOSIS — K831 Obstruction of bile duct: Secondary | ICD-10-CM | POA: Diagnosis not present

## 2024-07-02 DIAGNOSIS — N179 Acute kidney failure, unspecified: Secondary | ICD-10-CM | POA: Diagnosis not present

## 2024-07-03 DIAGNOSIS — K315 Obstruction of duodenum: Secondary | ICD-10-CM | POA: Diagnosis not present

## 2024-07-03 DIAGNOSIS — C23 Malignant neoplasm of gallbladder: Secondary | ICD-10-CM | POA: Diagnosis not present

## 2024-07-03 DIAGNOSIS — C801 Malignant (primary) neoplasm, unspecified: Secondary | ICD-10-CM | POA: Diagnosis not present

## 2024-07-03 DIAGNOSIS — K831 Obstruction of bile duct: Secondary | ICD-10-CM | POA: Diagnosis not present

## 2024-07-03 DIAGNOSIS — R7881 Bacteremia: Secondary | ICD-10-CM | POA: Diagnosis not present

## 2024-07-03 DIAGNOSIS — N179 Acute kidney failure, unspecified: Secondary | ICD-10-CM | POA: Diagnosis not present

## 2024-07-04 DIAGNOSIS — C23 Malignant neoplasm of gallbladder: Secondary | ICD-10-CM | POA: Diagnosis not present

## 2024-07-04 DIAGNOSIS — N179 Acute kidney failure, unspecified: Secondary | ICD-10-CM | POA: Diagnosis not present

## 2024-07-04 DIAGNOSIS — R7881 Bacteremia: Secondary | ICD-10-CM | POA: Diagnosis not present

## 2024-07-04 DIAGNOSIS — C801 Malignant (primary) neoplasm, unspecified: Secondary | ICD-10-CM | POA: Diagnosis not present

## 2024-07-04 DIAGNOSIS — K831 Obstruction of bile duct: Secondary | ICD-10-CM | POA: Diagnosis not present

## 2024-07-04 DIAGNOSIS — K8309 Other cholangitis: Secondary | ICD-10-CM | POA: Diagnosis not present

## 2024-07-04 DIAGNOSIS — K315 Obstruction of duodenum: Secondary | ICD-10-CM | POA: Diagnosis not present

## 2024-07-05 DIAGNOSIS — C23 Malignant neoplasm of gallbladder: Secondary | ICD-10-CM | POA: Diagnosis not present

## 2024-07-05 DIAGNOSIS — K8309 Other cholangitis: Secondary | ICD-10-CM | POA: Diagnosis not present

## 2024-07-05 DIAGNOSIS — K315 Obstruction of duodenum: Secondary | ICD-10-CM | POA: Diagnosis not present

## 2024-07-05 DIAGNOSIS — K831 Obstruction of bile duct: Secondary | ICD-10-CM | POA: Diagnosis not present

## 2024-07-05 DIAGNOSIS — C801 Malignant (primary) neoplasm, unspecified: Secondary | ICD-10-CM | POA: Diagnosis not present

## 2024-07-05 DIAGNOSIS — C786 Secondary malignant neoplasm of retroperitoneum and peritoneum: Secondary | ICD-10-CM | POA: Diagnosis not present

## 2024-07-05 DIAGNOSIS — R7881 Bacteremia: Secondary | ICD-10-CM | POA: Diagnosis not present

## 2024-07-06 DIAGNOSIS — K8309 Other cholangitis: Secondary | ICD-10-CM | POA: Diagnosis not present

## 2024-07-06 DIAGNOSIS — M48061 Spinal stenosis, lumbar region without neurogenic claudication: Secondary | ICD-10-CM | POA: Diagnosis not present

## 2024-07-06 DIAGNOSIS — M47816 Spondylosis without myelopathy or radiculopathy, lumbar region: Secondary | ICD-10-CM | POA: Diagnosis not present

## 2024-07-06 DIAGNOSIS — M5126 Other intervertebral disc displacement, lumbar region: Secondary | ICD-10-CM | POA: Diagnosis not present

## 2024-07-06 DIAGNOSIS — K831 Obstruction of bile duct: Secondary | ICD-10-CM | POA: Diagnosis not present

## 2024-07-06 DIAGNOSIS — M9933 Osseous stenosis of neural canal of lumbar region: Secondary | ICD-10-CM | POA: Diagnosis not present

## 2024-07-06 DIAGNOSIS — K315 Obstruction of duodenum: Secondary | ICD-10-CM | POA: Diagnosis not present

## 2024-07-06 DIAGNOSIS — C786 Secondary malignant neoplasm of retroperitoneum and peritoneum: Secondary | ICD-10-CM | POA: Diagnosis not present

## 2024-07-06 DIAGNOSIS — R7881 Bacteremia: Secondary | ICD-10-CM | POA: Diagnosis not present

## 2024-07-07 DIAGNOSIS — K831 Obstruction of bile duct: Secondary | ICD-10-CM | POA: Diagnosis not present

## 2024-07-07 DIAGNOSIS — B961 Klebsiella pneumoniae [K. pneumoniae] as the cause of diseases classified elsewhere: Secondary | ICD-10-CM | POA: Diagnosis not present

## 2024-07-07 DIAGNOSIS — A498 Other bacterial infections of unspecified site: Secondary | ICD-10-CM | POA: Diagnosis not present

## 2024-07-07 DIAGNOSIS — R7881 Bacteremia: Secondary | ICD-10-CM | POA: Diagnosis not present

## 2024-07-07 DIAGNOSIS — K315 Obstruction of duodenum: Secondary | ICD-10-CM | POA: Diagnosis not present

## 2024-07-07 DIAGNOSIS — K8309 Other cholangitis: Secondary | ICD-10-CM | POA: Diagnosis not present

## 2024-07-07 DIAGNOSIS — C801 Malignant (primary) neoplasm, unspecified: Secondary | ICD-10-CM | POA: Diagnosis not present

## 2024-07-07 DIAGNOSIS — C23 Malignant neoplasm of gallbladder: Secondary | ICD-10-CM | POA: Diagnosis not present

## 2024-07-07 DIAGNOSIS — N179 Acute kidney failure, unspecified: Secondary | ICD-10-CM | POA: Diagnosis not present

## 2024-07-08 DIAGNOSIS — A498 Other bacterial infections of unspecified site: Secondary | ICD-10-CM | POA: Diagnosis not present

## 2024-07-08 DIAGNOSIS — N179 Acute kidney failure, unspecified: Secondary | ICD-10-CM | POA: Diagnosis not present

## 2024-07-08 DIAGNOSIS — K831 Obstruction of bile duct: Secondary | ICD-10-CM | POA: Diagnosis not present

## 2024-07-08 DIAGNOSIS — B961 Klebsiella pneumoniae [K. pneumoniae] as the cause of diseases classified elsewhere: Secondary | ICD-10-CM | POA: Diagnosis not present

## 2024-07-08 DIAGNOSIS — C23 Malignant neoplasm of gallbladder: Secondary | ICD-10-CM | POA: Diagnosis not present

## 2024-07-08 DIAGNOSIS — K315 Obstruction of duodenum: Secondary | ICD-10-CM | POA: Diagnosis not present

## 2024-07-08 DIAGNOSIS — C801 Malignant (primary) neoplasm, unspecified: Secondary | ICD-10-CM | POA: Diagnosis not present

## 2024-07-08 DIAGNOSIS — K8309 Other cholangitis: Secondary | ICD-10-CM | POA: Diagnosis not present

## 2024-07-09 NOTE — Discharge Summary (Addendum)
 "  [facility] Discharge Summary  Admit Date: 07/01/2024 Discharge Date: 07/09/2024  Admitting Physician: Derryl Anselm Pottier, MD Discharge Physician: No att. providers found  Primary Care Provider: Darron Deatrice LABOR, MD, Phone [phone]  Discharge Destination: Home with home health: PT/OT/RN  Admission Diagnoses:  AKI (acute kidney injury) [N17.9]  Discharge Diagnoses:  Active Problems:   Biliary obstruction due to malignant neoplasm (CMS/HHS-HCC)   Peritoneal carcinomatosis (CMS/HHS-HCC) Resolved Problems:   AKI (acute kidney injury)   Bacteremia due to Pseudomonas  Primary Diagnosis: Admitted for AKI  Changes Made (with rationale):  Added Levaquin  To-Do List (incidental findings, follow-up studies, etc.): Please follow up with Dr. Donato as scheduled  Anticipatory Guidance for Outpatient Care:  Admitted for AKI secondary to dehydration. Blood cultures were positive for Klebsiella and Pseudomonas. Patient was treated with Zosyn  and transitioned to Levaquin on discharge per ID recommendations. Will follow up with Dr. Donato as scheduled.    Results Pending at Discharge:  Blood cultures Please see phone numbers at end of this summary for lab contact information.   Follow-up/Care Transition Plan: Sched. appts: Future Appointments  Date Time Provider Department Center  07/10/2024 To Be Determined Monique Dines, RN [facility] None  07/11/2024 11:30 AM Mardy Rexene Hun, PA [facility]  07/16/2024 10:30 AM [facility]1 [facility]  07/30/2024  1:30 PM Mettu, Buzzy Pitter, MD [facility]  08/04/2024 10:00 AM [facility]1 [facility]  09/24/2024 10:00 AM [facility] G5 [facility]    Follow-up info: Video visit with Dr. Donato 12/10 No follow-up provider specified.     Allergies/Intolerances:  Allergies  Allergen Reactions   Morphine Hives     New Adverse Drug Events: none  Medications:     Current  Discharge Medication List     START taking these medications      Instructions  BD POSIFLUSH NORMAL SALINE 0.9 injection Quantity: 300 mL Refills: 0 Generic drug: sodium chloride  0.9% flush  10 mLs by Intracatheter route as directed for [facility] (flush PBD drain three times a day) for up to 30 days   levoFLOXacin 750 MG tablet Quantity: 7 tablet Refills: 0 Stop taking on: July 16, 2024  Commonly known as: LEVAQUIN Take 1 tablet (750 mg total) by mouth once daily for 7 days Last time this was given: 750 mg on July 09, 2024  8:19 AM   tamsulosin 0.4 mg capsule Quantity: 30 capsule Refills: 0 Start taking on: July 10, 2024  Commonly known as: FLOMAX Take 1 capsule (0.4 mg total) by mouth once daily Take 30 minutes after same meal each day. Last time this was given: 0.4 mg on July 09, 2024  8:19 AM       CONTINUE taking these medications      Instructions  fentaNYL 25 mcg/hr patch Quantity: 10 patch Refills: 0  Commonly known as: DURAGESIC Place 1 patch onto the skin every third day for 30 days Doctor's comments: Cancer pain, palliative care Last time this was given: 1 patch on July 07, 2024  4:52 PM   HYDROmorphone  4 MG tablet Quantity: 120 tablet Refills: 0  Commonly known as: DILAUDID  Take 1 tablet (4 mg total) by mouth every 4 (four) hours as needed for Pain for up to 30 days Doctor's comments: Palliative care patient; cancer related pain Last time this was given: 2 mg on July 09, 2024  6:21 AM   ondansetron  4  MG disintegrating tablet Quantity: 42 tablet Refills: 0  Commonly known as: ZOFRAN -ODT Take 1 tablet (4 mg total) by mouth every 8 (eight) hours as needed for Nausea for up to 42 doses Last time this was given: Ask your nurse or doctor   pantoprazole 20 MG DR tablet Quantity: 30 tablet Refills: 11  Commonly known as: PROTONIX Take 1 tablet (20 mg total) by mouth once daily Last time this was given: Ask your nurse or doctor    polyethylene glycol packet Refills: 0  Commonly known as: MIRALAX Take 1 packet (17 g total) by mouth once daily Mix in 4-8ounces of fluid prior to taking.   sennosides 8.6 mg tablet Refills: 0  Commonly known as: SENOKOT Take 2 tablets by mouth at bedtime       STOP taking these medications    ACETAMINOPHEN EXTRA STRENGTH ORAL   COMPAZINE 10 MG tablet Generic drug: prochlorperazine   metoclopramide 10 MG tablet Commonly known as: REGLAN   potassium chloride  20 mEq packet Commonly known as: KLOR-CON    traZODone 50 MG tablet Commonly known as: DESYREL         History of Present Illness:   Michael Sims is a 63 y.o. 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 Patient had been experiencing worsening abdominal pain for the last few days. He also noticed that the urine output had been less frequent and more concentrated. He had lab drawn yesterday by home health and creatinine was 3.8 (baseline of 1.0). He had received 1 L IVF and came to the ED today. He also noticed more bleeding from the PBD, but around 100 cc in total.    In the ED patient had CT abd/pelvis with contrast: unfortunately there is generalized worsening metastatic disease with significant ascites. There is mention of stent migration in the colon; discussed with GI biliary fellow, that stent is from the PBD and is expected. The duodenal stent is patent. Labs: WBC 7.8, Hb 9.0, BUN/Cr: 84/3.5 Na: 130, K 4.4, Bicarb 16 Anion gap 12.   Patient denies any headache, acute changes in vision, speech or sensory changes, fever/chills, swelling, SOB, cough, chest pain.  _____________________   Hospital Course by Problem:  #Acute Kidney injury, pre-renal - resolved #Non anion gap metabolic acidosis.  #Hyponatremia  likely hypovolemic  Likely dehydration due to poor oral intake. Cr was 3.8 1 day prior to admission and he received 1 L IV NS outpatient. Cr on admission was 3.5. UrNa<10. UrCr 102. FENa 0.2% prerenal. He received 1 L LR in the ED and was continue LR @ 75 cc/hr for a day followed by 100 cc/hr. Received Bicarb drip (11/14-11/17). Cr improved to 1.1 on discharge.    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 Complaints of pus discharge from the PBD insertion site. He reported chills, and WBC count suddenly increased to 19 K. Had CT abd/pelvis with contrast: unfortunately there was generalized worsening metastatic disease with significant ascites. There was mention of stent migration in the colon; discussed with GI biliary fellow, and confirmed stent is from the PBD and as expected. The duodenal stent is patent. IR consulted for PBD drain check, and deferred not exchanging as drain  plus biliary stent patent. Continued flushing drains TID with 10 mL of sterile saline and keep to gravity bag drainage. ID consulted and source suspected to be biliary and received on zosyn  (11/12-11/18) . 11/12 Blood cx + Klebsiella oxytoca and Pseudomonas. 11/15 repeat clearance blood cultures no growth x 4 days. Transitioned to Levaquin 11/18 per susceptibilities with end date of 07/16/24. MRI T/L spine with contrast with no discitis osteomyelitis or other infectious process on imaging, and reassuring pain resolved. Bili improved to 1.8 on discharge from 3.5.     #Metastatic Gall bladder Cancer  #Malignant ascites Increased ascites on the CT abd. Increased abdominal distention likely due to receiving IVF, and underwent therapeutic paracentesis 11/12 and had 2.5L removed. s/p therapeutic paracentesis again on 11/15 due to  complaints of distended abdomin feeling of fullness, and had 1.5L of yellow peritoneal fluid removed. Discussed with Primary Oncologist Dr Donato; currently not receiving chemo therapy. Has video visit with Dr. Donato on 12/10.     #Hx of malignant duodenal stricture s/p stent placement  Patient has Duodenal stent. Reviewed CT abd with GI biliary fellow, the stent is patent. Tolerated low fiber diet without issues during admission.   #Malnutrition: He has been diagnosed with severe protein-calorie malnutrition in the context of chronic illness, based on energy intake meeting < or equal to 75% of estimated requirement for > or equal to 1 month, >10% unintentional weight loss in 6 months.    #Cancer pain Continued Fentanyl Patch, Dilaudid  1 mg for mod pain and 2 mg for severe pain.   #Chronic anemia  Hb 9.0 , chronic blood loss likely from hemobilia from tumor bleeding. Low Iron with low transferrin saturation. Ferritin is 304, this represents ACD plus IDA, got  IV iron as he has chronic blood loss from the PBD drains also got a unit of PRBC. Hgb 8.5 on discharge.    #Physical Debility PT/OT rec home health PT  Malnutrition: He has been diagnosed with severe protein-calorie malnutrition in the context of chronic illness, based on energy intake meeting < or equal to 75% of estimated requirement for > or equal to 1 month, >10% unintentional weight loss in 6 months.  - Recommend oral nutrition, Recommend vitamin/mineral supplementation, Recommend trend weight status.   Social Drivers of Health with Concerns   Tobacco Use: Medium Risk (06/11/2024)   Patient History    Smoking Tobacco Use: Former    Smokeless Tobacco Use: Never    Surgeries and Procedures Performed:  None   _____________________  Discharge Exam:  BP 115/81 (BP Location: Left upper arm, Patient Position: Sitting)   Pulse 98   Temp 36.4 C (97.5 F) (Oral)   Resp 18   Ht 182.9 cm (6')   Wt 69.5 kg (153 lb 3.2 oz)    SpO2 96%   BMI 20.78 kg/m  O2 Device: Nasal cannula (07/03/24 1524)  Physical Exam: General - NAD, resting comfortably HEENT - PERRL, EOMI, OP clear w/o lesions Neck - No thyromegaly, trachea midline, no JVD Chest - No palpable or visible abnormalities CV - RRR, normal S1/S2, no M/R/G Lungs - Good airway movement, CTA b/l, No W/R/R Abd - +BS, soft, mildly distended with fluid thrill, The PBD drain insertion sites are normal.  Ext - Warm, well-perfused, no C/C/E, +pulses UE and LE b/l, mild back pain.  Neuro - AO x 4, no FND  Pertinent Lab Testing: Recent Labs  Lab 07/07/24 0542 07/08/24 0552 07/09/24 0440  NA 135 135 134*  K 3.2* 3.4* 3.4*  CL 103 103 101  CO2 23 25 25   BUN 21* 20 20  CREATININE 1.1 1.1 1.1  GLUCOSE 90 90 85  CALCIUM  7.3* 7.5* 7.7*   Recent Labs  Lab 07/07/24 0542 07/08/24 0552 07/09/24 0440  AST 23 22 24   ALT 16 15 14*  ALKPHOS 638* 620* 666*  TBILI 1.6* 1.6* 1.8*    Recent Labs  Lab 07/07/24 0542 07/08/24 0552 07/09/24 0440  WBC 10.2* 9.5 10.1*  HGB 8.4* 8.3* 8.5*  HCT 25.5* 25.9* 27.0*  PLT 363 367 388   No results for input(s): APTT, INR in the last 168 hours.   Other Pertinent Labs:   Micro:  Lab Results  Component Value Date   BLDCULT No growth at 4 days 07/05/2024   BLDCULT No growth at 4 days 07/05/2024   BLDCULT Klebsiella oxytoca (!) 07/02/2024   BLDCULT Klebsiella oxytoca (!) 07/02/2024   BLDCULT Pseudomonas aeruginosa (!) 07/02/2024   BLDCULT No growth detected. 01/02/2024       Pertinent Imaging:   No results found.  _____________________  Code Status: Prior Goals of care were not addressed during this admission.   Status on Discharge:  Current activity: Walks occasionally (07/09/24 9171) Current mobility: Slightly limited (07/09/24 9171)  Activity Recommendation: activity as tolerated  Other Discharge Instructions: Services setup at discharge: Home Health PT/OT and Home Health Nurse Tubes/lines at  discharge: Drain: Biliary   Diet: No diet orders on file  Wound Care Order Instructions     None       _____________________  Time spent on discharge process: >30 minutes    HONG HUI PAN, PA [facility]  07/09/2024   Hospital Contact Information:  [facility]) [facility]) [facility])  Pending tests:  Laboratory: [phone] Microbiology: [phone] Pathology: [phone] Radiology: [phone]  General questions: [phone] Pending tests: Laboratory: [phone] Microbiology: [phone] Pathology: [phone] Radiology: [phone]  General questions:  [phone] Pending tests:  Laboratory: [phone] Microbiology: [phone] Pathology: [phone] Radiology: [phone]  General questions:  [phone] Pending tests: Laboratory: [phone]) [phone] Microbiology: [phone] Pathology: [phone] Radiology: [phone]  General questions:  [phone]    "

## 2024-07-10 DIAGNOSIS — R188 Other ascites: Secondary | ICD-10-CM | POA: Diagnosis not present

## 2024-07-10 DIAGNOSIS — G893 Neoplasm related pain (acute) (chronic): Secondary | ICD-10-CM | POA: Diagnosis not present

## 2024-07-10 DIAGNOSIS — E43 Unspecified severe protein-calorie malnutrition: Secondary | ICD-10-CM | POA: Diagnosis not present

## 2024-07-10 DIAGNOSIS — E785 Hyperlipidemia, unspecified: Secondary | ICD-10-CM | POA: Diagnosis not present

## 2024-07-10 DIAGNOSIS — K311 Adult hypertrophic pyloric stenosis: Secondary | ICD-10-CM | POA: Diagnosis not present

## 2024-07-10 DIAGNOSIS — K921 Melena: Secondary | ICD-10-CM | POA: Diagnosis not present

## 2024-07-10 DIAGNOSIS — K2100 Gastro-esophageal reflux disease with esophagitis, without bleeding: Secondary | ICD-10-CM | POA: Diagnosis not present

## 2024-07-10 DIAGNOSIS — T402X5D Adverse effect of other opioids, subsequent encounter: Secondary | ICD-10-CM | POA: Diagnosis not present

## 2024-07-10 DIAGNOSIS — C786 Secondary malignant neoplasm of retroperitoneum and peritoneum: Secondary | ICD-10-CM | POA: Diagnosis not present

## 2024-07-10 DIAGNOSIS — I10 Essential (primary) hypertension: Secondary | ICD-10-CM | POA: Diagnosis not present

## 2024-07-10 DIAGNOSIS — K831 Obstruction of bile duct: Secondary | ICD-10-CM | POA: Diagnosis not present

## 2024-07-10 DIAGNOSIS — E876 Hypokalemia: Secondary | ICD-10-CM | POA: Diagnosis not present

## 2024-07-10 DIAGNOSIS — F1010 Alcohol abuse, uncomplicated: Secondary | ICD-10-CM | POA: Diagnosis not present

## 2024-07-10 DIAGNOSIS — Z4803 Encounter for change or removal of drains: Secondary | ICD-10-CM | POA: Diagnosis not present

## 2024-07-10 DIAGNOSIS — D649 Anemia, unspecified: Secondary | ICD-10-CM | POA: Diagnosis not present

## 2024-07-10 DIAGNOSIS — K5903 Drug induced constipation: Secondary | ICD-10-CM | POA: Diagnosis not present

## 2024-07-10 DIAGNOSIS — C23 Malignant neoplasm of gallbladder: Secondary | ICD-10-CM | POA: Diagnosis not present

## 2024-07-11 DIAGNOSIS — K5909 Other constipation: Secondary | ICD-10-CM | POA: Diagnosis not present

## 2024-07-11 DIAGNOSIS — C23 Malignant neoplasm of gallbladder: Secondary | ICD-10-CM | POA: Diagnosis not present

## 2024-07-11 DIAGNOSIS — G893 Neoplasm related pain (acute) (chronic): Secondary | ICD-10-CM | POA: Diagnosis not present

## 2024-07-14 DIAGNOSIS — Z9689 Presence of other specified functional implants: Secondary | ICD-10-CM | POA: Diagnosis not present

## 2024-07-14 DIAGNOSIS — I10 Essential (primary) hypertension: Secondary | ICD-10-CM | POA: Diagnosis not present

## 2024-07-14 DIAGNOSIS — K2100 Gastro-esophageal reflux disease with esophagitis, without bleeding: Secondary | ICD-10-CM | POA: Diagnosis not present

## 2024-07-14 DIAGNOSIS — Z452 Encounter for adjustment and management of vascular access device: Secondary | ICD-10-CM | POA: Diagnosis not present

## 2024-07-14 DIAGNOSIS — T402X5D Adverse effect of other opioids, subsequent encounter: Secondary | ICD-10-CM | POA: Diagnosis not present

## 2024-07-14 DIAGNOSIS — C23 Malignant neoplasm of gallbladder: Secondary | ICD-10-CM | POA: Diagnosis not present

## 2024-07-14 DIAGNOSIS — Z4803 Encounter for change or removal of drains: Secondary | ICD-10-CM | POA: Diagnosis not present

## 2024-07-14 DIAGNOSIS — E785 Hyperlipidemia, unspecified: Secondary | ICD-10-CM | POA: Diagnosis not present

## 2024-07-14 DIAGNOSIS — E43 Unspecified severe protein-calorie malnutrition: Secondary | ICD-10-CM | POA: Diagnosis not present

## 2024-07-14 DIAGNOSIS — K5903 Drug induced constipation: Secondary | ICD-10-CM | POA: Diagnosis not present

## 2024-07-14 DIAGNOSIS — E871 Hypo-osmolality and hyponatremia: Secondary | ICD-10-CM | POA: Diagnosis not present

## 2024-07-14 DIAGNOSIS — K921 Melena: Secondary | ICD-10-CM | POA: Diagnosis not present

## 2024-07-14 DIAGNOSIS — C786 Secondary malignant neoplasm of retroperitoneum and peritoneum: Secondary | ICD-10-CM | POA: Diagnosis not present

## 2024-07-14 DIAGNOSIS — E876 Hypokalemia: Secondary | ICD-10-CM | POA: Diagnosis not present

## 2024-07-14 DIAGNOSIS — R180 Malignant ascites: Secondary | ICD-10-CM | POA: Diagnosis not present

## 2024-07-14 DIAGNOSIS — R7881 Bacteremia: Secondary | ICD-10-CM | POA: Diagnosis not present

## 2024-07-14 DIAGNOSIS — G893 Neoplasm related pain (acute) (chronic): Secondary | ICD-10-CM | POA: Diagnosis not present

## 2024-07-14 DIAGNOSIS — K831 Obstruction of bile duct: Secondary | ICD-10-CM | POA: Diagnosis not present

## 2024-07-14 DIAGNOSIS — F1010 Alcohol abuse, uncomplicated: Secondary | ICD-10-CM | POA: Diagnosis not present

## 2024-07-14 DIAGNOSIS — B965 Pseudomonas (aeruginosa) (mallei) (pseudomallei) as the cause of diseases classified elsewhere: Secondary | ICD-10-CM | POA: Diagnosis not present

## 2024-07-14 DIAGNOSIS — B9689 Other specified bacterial agents as the cause of diseases classified elsewhere: Secondary | ICD-10-CM | POA: Diagnosis not present

## 2024-07-14 DIAGNOSIS — N179 Acute kidney failure, unspecified: Secondary | ICD-10-CM | POA: Diagnosis not present

## 2024-07-14 DIAGNOSIS — D649 Anemia, unspecified: Secondary | ICD-10-CM | POA: Diagnosis not present

## 2024-07-14 DIAGNOSIS — K311 Adult hypertrophic pyloric stenosis: Secondary | ICD-10-CM | POA: Diagnosis not present

## 2024-07-14 DIAGNOSIS — Z79891 Long term (current) use of opiate analgesic: Secondary | ICD-10-CM | POA: Diagnosis not present

## 2024-07-15 DIAGNOSIS — E43 Unspecified severe protein-calorie malnutrition: Secondary | ICD-10-CM | POA: Diagnosis not present

## 2024-07-15 DIAGNOSIS — Z4803 Encounter for change or removal of drains: Secondary | ICD-10-CM | POA: Diagnosis not present

## 2024-07-15 DIAGNOSIS — D649 Anemia, unspecified: Secondary | ICD-10-CM | POA: Diagnosis not present

## 2024-07-15 DIAGNOSIS — E876 Hypokalemia: Secondary | ICD-10-CM | POA: Diagnosis not present

## 2024-07-15 DIAGNOSIS — E871 Hypo-osmolality and hyponatremia: Secondary | ICD-10-CM | POA: Diagnosis not present

## 2024-07-15 DIAGNOSIS — K5903 Drug induced constipation: Secondary | ICD-10-CM | POA: Diagnosis not present

## 2024-07-15 DIAGNOSIS — R180 Malignant ascites: Secondary | ICD-10-CM | POA: Diagnosis not present

## 2024-07-15 DIAGNOSIS — I10 Essential (primary) hypertension: Secondary | ICD-10-CM | POA: Diagnosis not present

## 2024-07-15 DIAGNOSIS — F1010 Alcohol abuse, uncomplicated: Secondary | ICD-10-CM | POA: Diagnosis not present

## 2024-07-15 DIAGNOSIS — B965 Pseudomonas (aeruginosa) (mallei) (pseudomallei) as the cause of diseases classified elsewhere: Secondary | ICD-10-CM | POA: Diagnosis not present

## 2024-07-15 DIAGNOSIS — K921 Melena: Secondary | ICD-10-CM | POA: Diagnosis not present

## 2024-07-15 DIAGNOSIS — K2100 Gastro-esophageal reflux disease with esophagitis, without bleeding: Secondary | ICD-10-CM | POA: Diagnosis not present

## 2024-07-15 DIAGNOSIS — K831 Obstruction of bile duct: Secondary | ICD-10-CM | POA: Diagnosis not present

## 2024-07-15 DIAGNOSIS — C786 Secondary malignant neoplasm of retroperitoneum and peritoneum: Secondary | ICD-10-CM | POA: Diagnosis not present

## 2024-07-15 DIAGNOSIS — C23 Malignant neoplasm of gallbladder: Secondary | ICD-10-CM | POA: Diagnosis not present

## 2024-07-15 DIAGNOSIS — K311 Adult hypertrophic pyloric stenosis: Secondary | ICD-10-CM | POA: Diagnosis not present

## 2024-07-15 DIAGNOSIS — G893 Neoplasm related pain (acute) (chronic): Secondary | ICD-10-CM | POA: Diagnosis not present

## 2024-07-15 DIAGNOSIS — Z452 Encounter for adjustment and management of vascular access device: Secondary | ICD-10-CM | POA: Diagnosis not present

## 2024-07-15 DIAGNOSIS — Z9689 Presence of other specified functional implants: Secondary | ICD-10-CM | POA: Diagnosis not present

## 2024-07-15 DIAGNOSIS — T402X5D Adverse effect of other opioids, subsequent encounter: Secondary | ICD-10-CM | POA: Diagnosis not present

## 2024-07-15 DIAGNOSIS — B9689 Other specified bacterial agents as the cause of diseases classified elsewhere: Secondary | ICD-10-CM | POA: Diagnosis not present

## 2024-07-15 DIAGNOSIS — Z79891 Long term (current) use of opiate analgesic: Secondary | ICD-10-CM | POA: Diagnosis not present

## 2024-07-15 DIAGNOSIS — R7881 Bacteremia: Secondary | ICD-10-CM | POA: Diagnosis not present

## 2024-07-15 DIAGNOSIS — E785 Hyperlipidemia, unspecified: Secondary | ICD-10-CM | POA: Diagnosis not present

## 2024-07-21 DIAGNOSIS — C186 Malignant neoplasm of descending colon: Secondary | ICD-10-CM | POA: Diagnosis not present

## 2024-07-21 DIAGNOSIS — C23 Malignant neoplasm of gallbladder: Secondary | ICD-10-CM | POA: Diagnosis not present

## 2024-07-21 DIAGNOSIS — K311 Adult hypertrophic pyloric stenosis: Secondary | ICD-10-CM | POA: Diagnosis not present

## 2024-07-21 DIAGNOSIS — N179 Acute kidney failure, unspecified: Secondary | ICD-10-CM | POA: Diagnosis not present

## 2024-07-23 ENCOUNTER — Telehealth: Payer: Self-pay | Admitting: Cardiovascular Disease

## 2024-07-23 DIAGNOSIS — K310 Acute dilatation of stomach: Secondary | ICD-10-CM | POA: Diagnosis not present

## 2024-07-23 DIAGNOSIS — R530 Neoplastic (malignant) related fatigue: Secondary | ICD-10-CM | POA: Diagnosis not present

## 2024-07-23 DIAGNOSIS — G893 Neoplasm related pain (acute) (chronic): Secondary | ICD-10-CM | POA: Diagnosis not present

## 2024-07-23 DIAGNOSIS — C23 Malignant neoplasm of gallbladder: Secondary | ICD-10-CM | POA: Diagnosis not present

## 2024-07-23 NOTE — Telephone Encounter (Signed)
 Fax is in you box to be signed for pt's Physical therapy

## 2024-07-24 NOTE — Telephone Encounter (Signed)
I signed them

## 2024-07-28 DIAGNOSIS — K311 Adult hypertrophic pyloric stenosis: Secondary | ICD-10-CM | POA: Diagnosis not present

## 2024-07-28 DIAGNOSIS — C23 Malignant neoplasm of gallbladder: Secondary | ICD-10-CM | POA: Diagnosis not present

## 2024-07-30 NOTE — Telephone Encounter (Signed)
Forms have been successfully faxed

## 2024-08-04 DIAGNOSIS — K311 Adult hypertrophic pyloric stenosis: Secondary | ICD-10-CM | POA: Diagnosis not present

## 2024-08-04 DIAGNOSIS — C23 Malignant neoplasm of gallbladder: Secondary | ICD-10-CM | POA: Diagnosis not present

## 2024-08-11 DIAGNOSIS — K311 Adult hypertrophic pyloric stenosis: Secondary | ICD-10-CM | POA: Diagnosis not present

## 2024-08-11 DIAGNOSIS — C23 Malignant neoplasm of gallbladder: Secondary | ICD-10-CM | POA: Diagnosis not present

## 2024-08-11 NOTE — "Home Health " (Addendum)
 Clinical Assessment: Patient received resting in bed, alert and oriented times 3, denies any falls.  Family at bedside.  SN main focus is labs which were obtained via R AC vP times 1 attempt and transported to [facility].  Site held, bleeding controlled. SN other focus was PIV hydration to R Upper arm times one attempt #22 gauge with 1000 ml NS at 100 ml/hr.  Patient tolerated well.  Family has been educated on discontinuation after hydration is complete and flushes which include daily flushes while not in use.  Patient tolerated all task well.  Family showed Sn a large area of excoriation of the patient's right flank d/t spillage of bile on skin while at hie last hospital visit.  WOCN recommended wound care.  [facility] numbers are in the home and the patient is able to verbalize when to call.    RECERTIFICATION STATEMENT: Reason for ongoing need for home health is hydration, labs, education regarding any new medications, wound care and management.    OTHER DISCIPLINES CONTINUING OR ADDED AND REASON:NA  -------------------- Close and Confirm -------------------- The following questions were asked following this contact: Close and Confirm Questions  1.    Is the visit schedule working out for you? Yes 2.    Do you have the supplies that you need? Yes  3.    Has your insurance changed since our last visit? No  4.    What else can we do to help you meet your goals and have the best (home health) experience possible? Satisfied

## 2024-08-13 DIAGNOSIS — N179 Acute kidney failure, unspecified: Secondary | ICD-10-CM | POA: Diagnosis not present

## 2024-08-13 NOTE — "Home Health " (Signed)
 Clinical Assessment:  This was a PRN visit to obtain labs vis VP L AC times 1 attempt, Site held times 3 minutes, bleeding controlled.  Patient tolerated well. Hepatic Function Test lab transported to [facility].  Patient left in stable condition with family present.  PIV was d/c by daughter yesterday d/t inability to flush.  Site unremarkable. [facility] numbers are in the home and the family is able to verbalize when to call.      -------------------- Close and Confirm -------------------- The following questions were asked following this contact: Close and Confirm Questions  1.    Is the visit schedule working out for you? Yes 2.    Do you have the supplies that you need? Yes  3.    Has your insurance changed since our last visit? No  4.    What else can we do to help you meet your goals and have the best (home health) experience possible? Satisfied

## 2024-08-19 ENCOUNTER — Telehealth: Payer: Self-pay

## 2024-08-19 ENCOUNTER — Other Ambulatory Visit: Payer: Self-pay

## 2024-08-19 ENCOUNTER — Emergency Department
Admission: EM | Admit: 2024-08-19 | Discharge: 2024-08-19 | Disposition: A | Discharge status: Home / Self Care | Attending: Emergency Medicine | Admitting: Emergency Medicine

## 2024-08-19 DIAGNOSIS — D500 Iron deficiency anemia secondary to blood loss (chronic): Secondary | ICD-10-CM | POA: Diagnosis not present

## 2024-08-19 DIAGNOSIS — R799 Abnormal finding of blood chemistry, unspecified: Secondary | ICD-10-CM | POA: Diagnosis present

## 2024-08-19 HISTORY — DX: Malignant neoplasm of gallbladder: C23

## 2024-08-19 LAB — CBC
HCT: 19.2 % — ABNORMAL LOW (ref 39.0–52.0)
Hemoglobin: 5.8 g/dL — ABNORMAL LOW (ref 13.0–17.0)
MCH: 28.6 pg (ref 26.0–34.0)
MCHC: 30.2 g/dL (ref 30.0–36.0)
MCV: 94.6 fL (ref 80.0–100.0)
Platelets: 456 K/uL — ABNORMAL HIGH (ref 150–400)
RBC: 2.03 MIL/uL — ABNORMAL LOW (ref 4.22–5.81)
RDW: 18.4 % — ABNORMAL HIGH (ref 11.5–15.5)
WBC: 9.1 K/uL (ref 4.0–10.5)
nRBC: 0 % (ref 0.0–0.2)

## 2024-08-19 LAB — BASIC METABOLIC PANEL WITH GFR
Anion gap: 11 (ref 5–15)
BUN: 21 mg/dL (ref 8–23)
CO2: 19 mmol/L — ABNORMAL LOW (ref 22–32)
Calcium: 8 mg/dL — ABNORMAL LOW (ref 8.9–10.3)
Chloride: 106 mmol/L (ref 98–111)
Creatinine, Ser: 0.92 mg/dL (ref 0.61–1.24)
GFR, Estimated: >60 mL/min
Glucose, Bld: 81 mg/dL (ref 70–99)
Potassium: 3.3 mmol/L — ABNORMAL LOW (ref 3.5–5.1)
Sodium: 137 mmol/L (ref 135–145)

## 2024-08-19 LAB — IRON AND TIBC
Iron: 10 ug/dL — ABNORMAL LOW (ref 45–182)
Saturation Ratios: 8 % — ABNORMAL LOW (ref 17.9–39.5)
TIBC: 133 ug/dL — ABNORMAL LOW (ref 250–450)
UIBC: 123 ug/dL

## 2024-08-19 LAB — PREPARE RBC (CROSSMATCH)

## 2024-08-19 MED ORDER — SODIUM CHLORIDE 0.9 % IV SOLN
10.0000 mL/h | Freq: Once | INTRAVENOUS | Status: AC
  Administered 2024-08-19: 10 mL/h via INTRAVENOUS

## 2024-08-19 MED ORDER — HYDROMORPHONE HCL 1 MG/ML IJ SOLN
1.0000 mg | Freq: Once | INTRAMUSCULAR | Status: AC
  Administered 2024-08-19: 1 mg via INTRAVENOUS
  Filled 2024-08-19: qty 1

## 2024-08-19 NOTE — ED Provider Notes (Signed)
 "  [facility] Provider Note    Event Date/Time   First MD Initiated Contact with Patient 08/19/24 [phone]     (approximate)   History   abnormal labs  HPI  Emidio Warrell is a 63 y.o. male with a history of metastatic cholangiocarcinoma with chronic slow blood loss secondary to tumors.  Brought in for outpatient lab of hemoglobin 4.9.  Daughter is here and primary caregiver, she reports he is at his baseline, she spoke with his oncologist, they recommend transfusion     Physical Exam   Triage Vital Signs: ED Triage Vitals  Encounter Vitals Group     BP 08/19/24 0900 98/74     Girls Systolic BP Percentile --      Girls Diastolic BP Percentile --      Boys Systolic BP Percentile --      Boys Diastolic BP Percentile --      Pulse Rate 08/19/24 0900 85     Resp 08/19/24 0900 14     Temp 08/19/24 0900 98 F (36.7 C)     Temp Source 08/19/24 0900 Oral     SpO2 08/19/24 0856 100 %     Weight 08/19/24 0906 63.5 kg (140 lb)     Height 08/19/24 0906 1.829 m (6')     Head Circumference --      Peak Flow --      Pain Score 08/19/24 0902 9     Pain Loc --      Pain Education --      Exclude from Growth Chart --     Most recent vital signs: Vitals:   08/19/24 1330 08/19/24 1400  BP: 116/85 102/69  Pulse: 79 78  Resp: 16 (!) 8  Temp:    SpO2: 100% 100%     General: Awake, no distress.  CV:  Good peripheral perfusion.  Resp:  Normal effort.  Abd:  No distention.  Abdominal drain right upper quadrant, pink-tinged fluid Other:     ED Results / Procedures / Treatments   Labs (all labs ordered are listed, but only abnormal results are displayed) Labs Reviewed  CBC - Abnormal; Notable for the following components:      Result Value   RBC 2.03 (*)    Hemoglobin 5.8 (*)    HCT 19.2 (*)    RDW 18.4 (*)    Platelets 456 (*)    All other components within normal limits  BASIC METABOLIC PANEL WITH GFR - Abnormal; Notable for the following  components:   Potassium 3.3 (*)    CO2 19 (*)    Calcium  8.0 (*)    All other components within normal limits  IRON AND TIBC - Abnormal; Notable for the following components:   Iron 10 (*)    TIBC 133 (*)    Saturation Ratios 8 (*)    All other components within normal limits  TYPE AND SCREEN  PREPARE RBC (CROSSMATCH)     EKG     RADIOLOGY     PROCEDURES:  Critical Care performed: yes  CRITICAL CARE Performed by: Lamar Price   Total critical care time: 30 minutes  Critical care time was exclusive of separately billable procedures and treating other patients.  Critical care was necessary to treat or prevent imminent or life-threatening deterioration.  Critical care was time spent personally by me on the following activities: development of treatment plan with patient and/or surrogate as well as nursing, discussions with consultants, evaluation of  patient's response to treatment, examination of patient, obtaining history from patient or surrogate, ordering and performing treatments and interventions, ordering and review of laboratory studies, ordering and review of radiographic studies, pulse oximetry and re-evaluation of patient's condition.   Procedures   MEDICATIONS ORDERED IN ED: Medications  0.9 %  sodium chloride  infusion (0 mL/hr Intravenous Stopped 08/19/24 1035)  HYDROmorphone  (DILAUDID ) injection 1 mg (1 mg Intravenous Given 08/19/24 0926)  HYDROmorphone  (DILAUDID ) injection 1 mg (1 mg Intravenous Given 08/19/24 1411)     IMPRESSION / MDM / ASSESSMENT AND PLAN / ED COURSE  I reviewed the triage vital signs and the nursing notes. Patient's presentation is most consistent with acute presentation with potential threat to life or bodily function.  Patient with metastatic cancer as detailed above, reviewed medical records from [facility], the patient does have chronic blood loss secondary to his cancer, this is likely the cause of his significant  anemia today.  Will transfuse 2 units PRBCs, patient consents to this.  Hemoglobin here is 5.8  ----------------------------------------- 2:24 PM on 08/19/2024 -----------------------------------------  Patient tolerating transfusion well, appropriate for discharge after conclusion      FINAL CLINICAL IMPRESSION(S) / ED DIAGNOSES   Final diagnoses:  Iron deficiency anemia due to chronic blood loss     Rx / DC Orders   ED Discharge Orders     None        Note:  This document was prepared using Dragon voice recognition software and may include unintentional dictation errors.   [name], MD 08/19/24 1424  "

## 2024-08-19 NOTE — Telephone Encounter (Addendum)
 Telephone note Paged by [facility] home [facility] for critical lab alert of hemoglobin of 5.0.  Hemoglobin was ordered by Dr. Deatrice Cage who is tagged for follow-up.  Per nurse, platelets and WBCs not provided.  She discussed patient with his daughter who reported no new symptoms today, no dizziness, stable vitals from prior. Home health nurse recommended patient present for care and daughter reported she would take him for evaluation tomorrow.   Michael Sims is a 63yoM with metastatic cholangiocarcinoma complicated by malignant biliary obstruction s/p biliary drainage, malignant duodenal obstruction s/p duodenal stent,, hypertension, hyperlipidemia.  He follows with Dr. Cage for hypertension and elevated calcium  score, last seen 09/2023 at which time he was not yet diagnosed with cholangiocarcinoma.  He was just admitted to [facility] 06/2024 with prerenal AKI, found to have Klebsiella and Pseudomonas bacteremia treated with IV Zosyn.  At that time, his hemoglobin was in the sevens to eights.  Last outpatient hemoglobin 7.6 on 08/11/24, with prior on 12/15 6.1. Chronic anemia was felt to be due to chronic GI blood loss due to malignancy and PBD drains per discharge summary in 06/2024.   Called patient to discuss next steps for management of acute on chronic anemia. Was unable to reach despite phone call to patient/his daughter x 2 and to alternate home phone number x1. Will re-attempt phone call.   Attempted again at 1:20AM with no answer. No voicemail left as voicemail box did not have message with patient name so did not leave PHI.  Able to reach home health nurse Niels Pica RN from [facility]- she had already instructed patient to seek care and is aware that I was unable to reach them. She reports clear plan from his daughter for him to seek care on 12/30.

## 2024-08-19 NOTE — ED Triage Notes (Addendum)
 Pt from home via [facility] with c/o abnormal labs. Per EMS, pt's hgb was 4.9 and PCP recommended coming to ER. Per EMS, pt has stage 4 gallbladder cancer. Per pt's daughter, pt called oncology and oncology believes that the tumor may be bleeding. Pt c/o 9/10 generalized pain and reports dizziness.

## 2024-08-20 LAB — TYPE AND SCREEN
ABO/RH(D): O POS
Antibody Screen: NEGATIVE
Unit division: 0
Unit division: 0

## 2024-08-20 LAB — BPAM RBC
Blood Product Expiration Date: 202601012359
Blood Product Expiration Date: 202601262359
ISSUE DATE / TIME: 202512301025
ISSUE DATE / TIME: 202512301300
Unit Type and Rh: 5100
Unit Type and Rh: 5100

## 2024-08-23 ENCOUNTER — Emergency Department

## 2024-08-23 ENCOUNTER — Other Ambulatory Visit: Payer: Self-pay

## 2024-08-23 ENCOUNTER — Emergency Department
Admission: EM | Admit: 2024-08-23 | Discharge: 2024-08-23 | Disposition: A | Discharge status: Home / Self Care | Attending: Emergency Medicine | Admitting: Emergency Medicine

## 2024-08-23 DIAGNOSIS — K651 Peritoneal abscess: Secondary | ICD-10-CM | POA: Insufficient documentation

## 2024-08-23 DIAGNOSIS — R109 Unspecified abdominal pain: Secondary | ICD-10-CM | POA: Diagnosis present

## 2024-08-23 LAB — CBC
HCT: 25.3 % — ABNORMAL LOW (ref 39.0–52.0)
Hemoglobin: 7.9 g/dL — ABNORMAL LOW (ref 13.0–17.0)
MCH: 27.7 pg (ref 26.0–34.0)
MCHC: 31.2 g/dL (ref 30.0–36.0)
MCV: 88.8 fL (ref 80.0–100.0)
Platelets: 440 K/uL — ABNORMAL HIGH (ref 150–400)
RBC: 2.85 MIL/uL — ABNORMAL LOW (ref 4.22–5.81)
RDW: 19.6 % — ABNORMAL HIGH (ref 11.5–15.5)
WBC: 7.3 K/uL (ref 4.0–10.5)
nRBC: 0 % (ref 0.0–0.2)

## 2024-08-23 LAB — COMPREHENSIVE METABOLIC PANEL WITH GFR
ALT: 13 U/L (ref 0–44)
AST: 29 U/L (ref 15–41)
Albumin: 2.3 g/dL — ABNORMAL LOW (ref 3.5–5.0)
Alkaline Phosphatase: 533 U/L — ABNORMAL HIGH (ref 38–126)
Anion gap: 12 (ref 5–15)
BUN: 20 mg/dL (ref 8–23)
CO2: 18 mmol/L — ABNORMAL LOW (ref 22–32)
Calcium: 7.6 mg/dL — ABNORMAL LOW (ref 8.9–10.3)
Chloride: 104 mmol/L (ref 98–111)
Creatinine, Ser: 0.85 mg/dL (ref 0.61–1.24)
GFR, Estimated: >60 mL/min
Glucose, Bld: 117 mg/dL — ABNORMAL HIGH (ref 70–99)
Potassium: 3.6 mmol/L (ref 3.5–5.1)
Sodium: 135 mmol/L (ref 135–145)
Total Bilirubin: 2 mg/dL — ABNORMAL HIGH (ref 0.0–1.2)
Total Protein: 6 g/dL — ABNORMAL LOW (ref 6.5–8.1)

## 2024-08-23 LAB — LACTIC ACID, PLASMA: Lactic Acid, Venous: 1.4 mmol/L (ref 0.5–1.9)

## 2024-08-23 LAB — LIPASE, BLOOD: Lipase: 53 U/L — ABNORMAL HIGH (ref 11–51)

## 2024-08-23 MED ORDER — HYDROMORPHONE HCL 1 MG/ML IJ SOLN
1.0000 mg | Freq: Once | INTRAMUSCULAR | Status: AC
  Administered 2024-08-23: 1 mg via INTRAVENOUS
  Filled 2024-08-23: qty 1

## 2024-08-23 MED ORDER — PIPERACILLIN-TAZOBACTAM 3.375 G IVPB 30 MIN
3.3750 g | Freq: Once | INTRAVENOUS | Status: AC
  Administered 2024-08-23: 3.375 g via INTRAVENOUS
  Filled 2024-08-23: qty 50

## 2024-08-23 MED ORDER — IOHEXOL 300 MG/ML  SOLN
100.0000 mL | Freq: Once | INTRAMUSCULAR | Status: AC | PRN
  Administered 2024-08-23: 100 mL via INTRAVENOUS

## 2024-08-23 NOTE — ED Notes (Signed)
 [facility] called spoke with Joy per Dr Arlander demographic faxed. Images power shared

## 2024-08-23 NOTE — Discharge Instructions (Signed)
 Please go directly to [facility] given that you he did not want to be admitted

## 2024-08-23 NOTE — ED Triage Notes (Addendum)
 Pt BIB [facility] from home c/o L upper quadrant abd pain and pain above/below the umbilicus. Has stage 3 gallbladder cancer. Last BM was 4 days ago. Has 50mcg fentanyl patch on.  Was given 2mg  dilaudid  PO at home this AM.Alert and oriented. Daughter at bedside states she gave him a suppository with no relief.  114/80 86 HR 99RA

## 2024-08-23 NOTE — ED Provider Notes (Addendum)
 "  [facility] Provider Note    Event Date/Time   First MD Initiated Contact with Patient 08/23/24 1239     (approximate)   History   Abdominal Pain   HPI  Michael Sims is a 64 y.o. male with a history of metastatic cholangiocarcinoma who presents with concerns for constipation, abdominal pain.  Review of records demonstrate the patient has had small bowel obstruction in the past.  His daughter reports that he has been constipated so she has been treating him with laxatives and he started complaining of bilateral abdominal pain but he has not been distended.  Followed at [facility] oncology     Physical Exam   Triage Vital Signs: ED Triage Vitals  Encounter Vitals Group     BP 08/23/24 1250 101/82     Girls Systolic BP Percentile --      Girls Diastolic BP Percentile --      Boys Systolic BP Percentile --      Boys Diastolic BP Percentile --      Pulse Rate 08/23/24 1250 79     Resp 08/23/24 1250 18     Temp 08/23/24 1250 98.4 F (36.9 C)     Temp Source 08/23/24 1250 Oral     SpO2 08/23/24 1250 99 %     Weight 08/23/24 1243 65.8 kg (145 lb)     Height 08/23/24 1243 1.829 m (6')     Head Circumference --      Peak Flow --      Pain Score 08/23/24 1243 10     Pain Loc --      Pain Education --      Exclude from Growth Chart --     Most recent vital signs: Vitals:   08/23/24 1250  BP: 101/82  Pulse: 79  Resp: 18  Temp: 98.4 F (36.9 C)  SpO2: 99%     General: Awake, no distress.  CV:  Good peripheral perfusion.  Resp:  Normal effort.  Abd:  No significant distention, multiple drains noted right upper quadrant, overall no tenderness to palpation Other:     ED Results / Procedures / Treatments   Labs (all labs ordered are listed, but only abnormal results are displayed) Labs Reviewed  CBC - Abnormal; Notable for the following components:      Result Value   RBC 2.85 (*)    Hemoglobin 7.9 (*)    HCT 25.3 (*)    RDW 19.6 (*)     Platelets 440 (*)    All other components within normal limits  COMPREHENSIVE METABOLIC PANEL WITH GFR - Abnormal; Notable for the following components:   CO2 18 (*)    Glucose, Bld 117 (*)    Calcium  7.6 (*)    Total Protein 6.0 (*)    Albumin 2.3 (*)    Alkaline Phosphatase 533 (*)    Total Bilirubin 2.0 (*)    All other components within normal limits  LIPASE, BLOOD - Abnormal; Notable for the following components:   Lipase 53 (*)    All other components within normal limits  CULTURE, BLOOD (ROUTINE X 2)  CULTURE, BLOOD (ROUTINE X 2)  LACTIC ACID, PLASMA     EKG     RADIOLOGY CT abdomen pelvis pending    PROCEDURES:  Critical Care performed:   Procedures   MEDICATIONS ORDERED IN ED: Medications  piperacillin -tazobactam (ZOSYN ) IVPB 3.375 g (has no administration in time range)  HYDROmorphone  (DILAUDID ) injection 1 mg (  1 mg Intravenous Given 08/23/24 1249)  iohexol  (OMNIPAQUE ) 300 MG/ML solution 100 mL (100 mLs Intravenous Contrast Given 08/23/24 1410)     IMPRESSION / MDM / ASSESSMENT AND PLAN / ED COURSE  I reviewed the triage vital signs and the nursing notes. Patient's presentation is most consistent with severe exacerbation of chronic illness.  Patient presents with abdominal pain in the setting of metastatic cholangiocarcinoma on multiple narcotics for pain control.  Differential includes constipation, small bowel obstruction, worsening cancer  At the request of his oncologist will obtain CT abdomen pelvis  Lab work is overall not significant change from prior, his hemoglobin has increased after transfusion given 4 days ago.  CT abdomen pelvis demonstrates large intra-abdominal abscess  Will add lactic acid, blood cultures, start IV Zosyn   Have contacted [facility] transfer center to arrange for transfer  ----------------------------------------- 3:20 PM on 08/23/2024 ----------------------------------------- [facility] is refused transfer due to capacity, they  have refused to put the patient on a wait list but have reported that they will perform administrative review  Discussed with Dr. Tye of surgery, he will see the patient recommends hospitalist admission and IV antibiotics  ----------------------------------------- 3:41 PM on 08/23/2024 ----------------------------------------- Patient and daughter have decided to leave AMA, they want to go to [facility], he has decisional capacity and understand the risk.  They will finish the IV Zosyn  here and then leave to go to [facility]      FINAL CLINICAL IMPRESSION(S) / ED DIAGNOSES   Final diagnoses:  Intra-abdominal abscess (HCC)     Rx / DC Orders   ED Discharge Orders     None        Note:  This document was prepared using Dragon voice recognition software and may include unintentional dictation errors.   [name], MD 08/23/24 1534    [name], MD 08/23/24 1542  "

## 2024-09-01 LAB — CULTURE, BLOOD (ROUTINE X 2)
Culture: NO GROWTH
Culture: NO GROWTH
